# Patient Record
Sex: Female | Born: 1990 | Race: White | Hispanic: No | Marital: Married | Smoking: Current every day smoker
Health system: Southern US, Community
[De-identification: ages and names within clinical notes are randomized; demographics above are authoritative.]

## PROBLEM LIST (undated history)

## (undated) DIAGNOSIS — E559 Vitamin D deficiency, unspecified: Secondary | ICD-10-CM

## (undated) DIAGNOSIS — J45909 Unspecified asthma, uncomplicated: Secondary | ICD-10-CM

## (undated) HISTORY — DX: Vitamin D deficiency, unspecified: E55.9

## (undated) HISTORY — DX: Unspecified asthma, uncomplicated: J45.909

## (undated) HISTORY — PX: TOOTH EXTRACTION: SUR596

---

## 2018-11-15 ENCOUNTER — Ambulatory Visit (INDEPENDENT_AMBULATORY_CARE_PROVIDER_SITE_OTHER): Payer: 59 | Admitting: Physician Assistant

## 2018-11-15 ENCOUNTER — Encounter: Payer: Self-pay | Admitting: Physician Assistant

## 2018-11-15 VITALS — BP 140/78 | HR 97 | Temp 99.3°F | Ht 62.0 in | Wt 179.2 lb

## 2018-11-15 DIAGNOSIS — F419 Anxiety disorder, unspecified: Secondary | ICD-10-CM | POA: Diagnosis not present

## 2018-11-15 DIAGNOSIS — F321 Major depressive disorder, single episode, moderate: Secondary | ICD-10-CM | POA: Diagnosis not present

## 2018-11-15 DIAGNOSIS — E559 Vitamin D deficiency, unspecified: Secondary | ICD-10-CM

## 2018-11-15 LAB — CBC WITH DIFFERENTIAL/PLATELET
Basophils Absolute: 0 10*3/uL (ref 0.0–0.1)
Basophils Relative: 0.6 % (ref 0.0–3.0)
Eosinophils Absolute: 0.1 10*3/uL (ref 0.0–0.7)
Eosinophils Relative: 1.4 % (ref 0.0–5.0)
HCT: 39.6 % (ref 36.0–46.0)
Hemoglobin: 13.2 g/dL (ref 12.0–15.0)
Lymphocytes Relative: 23.1 % (ref 12.0–46.0)
Lymphs Abs: 1.7 10*3/uL (ref 0.7–4.0)
MCHC: 33.4 g/dL (ref 30.0–36.0)
MCV: 90.6 fl (ref 78.0–100.0)
Monocytes Absolute: 0.5 10*3/uL (ref 0.1–1.0)
Monocytes Relative: 6.1 % (ref 3.0–12.0)
Neutro Abs: 5.1 10*3/uL (ref 1.4–7.7)
Neutrophils Relative %: 68.8 % (ref 43.0–77.0)
PLATELETS: 320 10*3/uL (ref 150.0–400.0)
RBC: 4.37 Mil/uL (ref 3.87–5.11)
RDW: 12.6 % (ref 11.5–15.5)
WBC: 7.4 10*3/uL (ref 4.0–10.5)

## 2018-11-15 LAB — COMPREHENSIVE METABOLIC PANEL
ALT: 12 U/L (ref 0–35)
AST: 13 U/L (ref 0–37)
Albumin: 4.4 g/dL (ref 3.5–5.2)
Alkaline Phosphatase: 52 U/L (ref 39–117)
BUN: 10 mg/dL (ref 6–23)
CO2: 27 meq/L (ref 19–32)
Calcium: 9.6 mg/dL (ref 8.4–10.5)
Chloride: 102 mEq/L (ref 96–112)
Creatinine, Ser: 0.81 mg/dL (ref 0.40–1.20)
GFR: 89.78 mL/min (ref >60.00)
Glucose, Bld: 95 mg/dL (ref 70–99)
POTASSIUM: 4.1 meq/L (ref 3.5–5.1)
Sodium: 137 mEq/L (ref 135–145)
Total Bilirubin: 0.4 mg/dL (ref 0.2–1.2)
Total Protein: 6.9 g/dL (ref 6.0–8.3)

## 2018-11-15 LAB — VITAMIN D 25 HYDROXY (VIT D DEFICIENCY, FRACTURES): VITD: 25.52 ng/mL — ABNORMAL LOW (ref 30.00–100.00)

## 2018-11-15 LAB — TSH: TSH: 1.11 u[IU]/mL (ref 0.35–4.50)

## 2018-11-15 MED ORDER — SERTRALINE HCL 25 MG PO TABS: 25.0000 mg | ORAL_TABLET | Freq: Every day | ORAL | 1 refills | Status: DC

## 2018-11-15 NOTE — Patient Instructions (Signed)
It was great to see you!  Start Zoloft 25 mg daily.  Please find a therapist if you can!  Let's follow-up in 4-6 weeks, sooner if you have concerns.  Take care,  Jarold MottoSamantha Plez Belton PA-C

## 2018-11-15 NOTE — Progress Notes (Signed)
Diane [name] is a 27 y.o. female here for a new problem.  History of Present Illness:   Chief Complaint  Patient presents with  . Establish Care  . Anxiety  . Depression    HPI  Depression and anxiety -- patient with an increase in anxiety and depression over the past year. No significant hx in the past, but possible intermittent issues. She states that she is dealing with family drama, work stress, and difficulty maintaining work-life balance. Denies SI/HI. Has tried CBD oil but that hasn't helped for her. Husband and family are good support system. Is interested in therapy but doesn't have the time to go. Also has hx vit D deficiency. Has taken high doses of vit D in the past.   Past Medical History:  Diagnosis Date  . Asthma   . Vitamin D deficiency      Social History   Socioeconomic History  . Marital status: Married    Spouse name: Not on file  . Number of children: Not on file  . Years of education: Not on file  . Highest education level: Not on file  Occupational History  . Not on file  Social Needs  . Financial resource strain: Not on file  . Food insecurity:    Worry: Not on file    Inability: Not on file  . Transportation needs:    Medical: Not on file    Non-medical: Not on file  Tobacco Use  . Smoking status: Former Smoker    Last attempt to quit: 03/16/2018    Years since quitting: 0.6  . Smokeless tobacco: Never Used  Substance and Sexual Activity  . Alcohol use: Yes    Comment: occ  . Drug use: Never  . Sexual activity: Yes  Lifestyle  . Physical activity:    Days per week: Not on file    Minutes per session: Not on file  . Stress: Not on file  Relationships  . Social connections:    Talks on phone: Not on file    Gets together: Not on file    Attends religious service: Not on file    Active member of club or organization: Not on file    Attends meetings of clubs or organizations: Not on file    Relationship status: Not on file  .  Intimate partner violence:    Fear of current or ex partner: Not on file    Emotionally abused: Not on file    Physically abused: Not on file    Forced sexual activity: Not on file  Other Topics Concern  . Not on file  Social History Narrative   Married, no children   Works with loans for [employer]    Past Surgical History:  Procedure Laterality Date  . TOOTH EXTRACTION      Family History  Problem Relation Age of Onset  . Throat cancer Father     No Known Allergies  Current Medications:   Current Outpatient Medications:  .  norgestimate-ethinyl estradiol (ORTHO-CYCLEN,SPRINTEC,PREVIFEM) 0.25-35 MG-MCG tablet, Take 1 tablet by mouth daily., Disp: , Rfl:  .  sertraline (ZOLOFT) 25 MG tablet, Take 1 tablet (25 mg total) by mouth daily., Disp: 30 tablet, Rfl: 1   Review of Systems:   ROS  Negative unless otherwise specified per HPI.  Vitals:   Vitals:   11/15/18 1302  BP: 140/78  Pulse: 97  Temp: 99.3 F (37.4 C)  TempSrc: Oral  SpO2: 98%  Weight: 179 lb 3.2 oz (  81.3 kg)  Height: 5\' 2"  (1.575 m)     Body mass index is 32.78 kg/m.  Physical Exam:   Physical Exam Vitals signs and nursing note reviewed.  Constitutional:      General: She is not in acute distress.    Appearance: She is well-developed. She is not ill-appearing or toxic-appearing.  HENT:     Head: Normocephalic and atraumatic.  Cardiovascular:     Rate and Rhythm: Normal rate and regular rhythm.     Heart sounds: Normal heart sounds.  Pulmonary:     Effort: Pulmonary effort is normal. No accessory muscle usage or respiratory distress.     Breath sounds: Normal breath sounds.  Skin:    General: Skin is warm and dry.  Neurological:     Mental Status: She is alert.  Psychiatric:        Mood and Affect: Affect is tearful.        Speech: Speech normal.        Behavior: Behavior is cooperative.       No results found for this or any previous visit.  Assessment and Plan:   Diane [name] was  seen today for establish care, anxiety and depression.  Diagnoses and all orders for this visit:  Vitamin D deficiency Re-check today. -     VITAMIN D 25 Hydroxy (Vit-D Deficiency, Fractures)  Anxiety and Depression, major, single episode, moderate (HCC) No SI/HI on exam today. Check labs for organic cause of symptoms. Start Zoloft 25 mg daily. Provided list of therapists. Follow-up in 4-6 weeks, sooner if issues. I discussed with patient that if they develop any SI, to tell someone immediately and seek medical attention. -     CBC with Differential/Platelet -     Comprehensive metabolic panel -     TSH  Other orders -     sertraline (ZOLOFT) 25 MG tablet; Take 1 tablet (25 mg total) by mouth daily.    . Reviewed expectations re: course of current medical issues. . Discussed self-management of symptoms. . Outlined signs and symptoms indicating need for more acute intervention. . Patient verbalized understanding and all questions were answered. . See orders for this visit as documented in the electronic medical record. . Patient received an After-Visit Summary.   Jarold Motto, PA-C

## 2018-12-13 ENCOUNTER — Ambulatory Visit (INDEPENDENT_AMBULATORY_CARE_PROVIDER_SITE_OTHER): Payer: 59 | Admitting: Physician Assistant

## 2018-12-13 ENCOUNTER — Encounter: Payer: Self-pay | Admitting: Physician Assistant

## 2018-12-13 VITALS — BP 100/56 | HR 69 | Temp 99.0°F | Ht 62.0 in | Wt 181.4 lb

## 2018-12-13 DIAGNOSIS — Z23 Encounter for immunization: Secondary | ICD-10-CM

## 2018-12-13 DIAGNOSIS — F321 Major depressive disorder, single episode, moderate: Secondary | ICD-10-CM

## 2018-12-13 DIAGNOSIS — F419 Anxiety disorder, unspecified: Secondary | ICD-10-CM | POA: Diagnosis not present

## 2018-12-13 MED ORDER — SERTRALINE HCL 25 MG PO TABS: 25.0000 mg | ORAL_TABLET | Freq: Every day | ORAL | 1 refills | Status: DC

## 2018-12-13 MED ORDER — NORGESTIMATE-ETH ESTRADIOL 0.25-35 MG-MCG PO TABS: 1.0000 | ORAL_TABLET | Freq: Every day | ORAL | 11 refills | Status: DC

## 2018-12-13 NOTE — Progress Notes (Signed)
Diane [name] is a 28 y.o. female is here to discuss: Anxiety & Depression.  I acted as a Neurosurgeon for [employer], PA-C Diane Mull, LPN  History of Present Illness:   Chief Complaint  Patient presents with  . Anxiety  . Depression    HPI  Anxiety & Depression Pt here for one month follow up. Pt states she is feeling better since last visit and taking Zoloft 25 mg daily. Pt was having nausea in the beginning but has resolved. She feels as though her anxiety is slowly improving, doesn't feel numb. Denies significant side effects from medications.   There are no preventive care reminders to display for this patient.  Past Medical History:  Diagnosis Date  . Asthma   . Vitamin D deficiency      Social History   Socioeconomic History  . Marital status: Married    Spouse name: Not on file  . Number of children: Not on file  . Years of education: Not on file  . Highest education level: Not on file  Occupational History  . Not on file  Social Needs  . Financial resource strain: Not on file  . Food insecurity:    Worry: Not on file    Inability: Not on file  . Transportation needs:    Medical: Not on file    Non-medical: Not on file  Tobacco Use  . Smoking status: Former Smoker    Last attempt to quit: 03/16/2018    Years since quitting: 0.7  . Smokeless tobacco: Never Used  Substance and Sexual Activity  . Alcohol use: Yes    Comment: occ  . Drug use: Never  . Sexual activity: Yes  Lifestyle  . Physical activity:    Days per week: Not on file    Minutes per session: Not on file  . Stress: Not on file  Relationships  . Social connections:    Talks on phone: Not on file    Gets together: Not on file    Attends religious service: Not on file    Active member of club or organization: Not on file    Attends meetings of clubs or organizations: Not on file    Relationship status: Not on file  . Intimate partner violence:    Fear of current or ex  partner: Not on file    Emotionally abused: Not on file    Physically abused: Not on file    Forced sexual activity: Not on file  Other Topics Concern  . 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 TOOTH EXTRACTION      Family History  Problem Relation Age of Onset  . Throat cancer Father     PMHx, SurgHx, SocialHx, FamHx, Medications, and Allergies were reviewed in the Visit Navigator and updated as appropriate.   There are no active problems to display for this patient.   Social History   Tobacco Use  . Smoking status: Former Smoker    Last attempt to quit: 03/16/2018    Years since quitting: 0.7  . Smokeless tobacco: Never Used  Substance Use Topics  . Alcohol use: Yes    Comment: occ  . Drug use: Never    Current Medications and Allergies:    Current Outpatient Medications:  .  norgestimate-ethinyl estradiol (ORTHO-CYCLEN,SPRINTEC,PREVIFEM) 0.25-35 MG-MCG tablet, Take 1 tablet by mouth  daily., Disp: 1 Package, Rfl: 11 .  sertraline (ZOLOFT) 25 MG tablet, Take 1 tablet (25 mg total) by mouth daily., Disp: 30 tablet, Rfl: 1  No Known Allergies  Review of Systems   Review of Systems  Negative unless otherwise specified per HPI.  Vitals:   Vitals:   12/13/18 1307  BP: (!) 100/56  Pulse: 69  Temp: 99 F (37.2 C)  TempSrc: Oral  SpO2: 96%  Weight: 181 lb 6.1 oz (82.3 kg)  Height: 5\' 2"  (1.575 m)     Body mass index is 33.17 kg/m.   Physical Exam:    Physical Exam Vitals signs and nursing note reviewed.  Constitutional:      General: She is not in acute distress.    Appearance: She is well-developed. She is not ill-appearing or toxic-appearing.  Cardiovascular:     Rate and Rhythm: Normal rate and regular rhythm.     Pulses: Normal pulses.     Heart sounds: Normal heart sounds, S1 normal and S2 normal.     Comments: No LE  edema Pulmonary:     Effort: Pulmonary effort is normal.     Breath sounds: Normal breath sounds.  Skin:    General: Skin is warm and dry.  Neurological:     Mental Status: She is alert.     GCS: GCS eye subscore is 4. GCS verbal subscore is 5. GCS motor subscore is 6.  Psychiatric:        Speech: Speech normal.        Behavior: Behavior normal. Behavior is cooperative.      Assessment and Plan:    Diane [name] was seen today for anxiety and depression.  Diagnoses and all orders for this visit:  Anxiety and Depression, major, single episode, moderate (HCC) Doing well on Zoloft 25 mg daily. She would like to wait two weeks to see if she has any further improvement on current dosage. She states that she may increase to 50 mg after that if she feels like she would like more improvement with her anxiety. Patient was advised as follows:  1. Continue Zoloft 25 mg daily. 2. If after two weeks, you are interested in increasing your dosage, take 2 x 25 mg tablets daily (50 mg total). Take this daily and keep us posted on your progress.  Need for prophylactic vaccination with combined diphtheria-tetanus-pertussis (DTP) vaccine -     Tdap vaccine greater than or equal to 7yo IM  Other orders -     sertraline (ZOLOFT) 25 MG tablet; Take 1 tablet (25 mg total) by mouth daily. -     norgestimate-ethinyl estradiol (ORTHO-CYCLEN,SPRINTEC,PREVIFEM) 0.25-35 MG-MCG tablet; Take 1 tablet by mouth daily.   . Reviewed expectations re: course of current medical issues. . Discussed self-management of symptoms. . Outlined signs and symptoms indicating need for more acute intervention. . Patient verbalized understanding and all questions were answered. . See orders for this visit as documented in the electronic medical record. . Patient received an After Visit Summary.  CMA or LPN served as scribe during this visit. History, Physical, and Plan performed by medical provider. The above documentation has  been reviewed and is accurate and complete.   Diane MottoSamantha Donivin Wirt, PA-C [place] [place] 12/13/2018  Follow-up: Return in about 6 months (around 06/13/2019) for Medication F/U.

## 2018-12-13 NOTE — Patient Instructions (Signed)
It was great to see you!  1. Continue Zoloft 25 mg daily. 2. If after two weeks, you are interested in increasing your dosage, take 2 x 25 mg tablets daily (50 mg total). Take this daily and keep [place] posted on your progress.  Let's follow-up in 6 months, sooner if you have concerns.  Take care,  Jarold Motto PA-C

## 2018-12-29 ENCOUNTER — Encounter: Payer: Self-pay | Admitting: Physician Assistant

## 2019-01-04 ENCOUNTER — Other Ambulatory Visit: Payer: Self-pay | Admitting: Physician Assistant

## 2019-01-04 MED ORDER — SERTRALINE HCL 50 MG PO TABS: 50.0000 mg | ORAL_TABLET | Freq: Every day | ORAL | 1 refills | Status: DC

## 2019-05-17 ENCOUNTER — Encounter: Payer: Self-pay | Admitting: Physician Assistant

## 2019-06-06 ENCOUNTER — Encounter: Payer: Self-pay | Admitting: Physician Assistant

## 2019-06-06 ENCOUNTER — Ambulatory Visit (INDEPENDENT_AMBULATORY_CARE_PROVIDER_SITE_OTHER): Payer: 59 | Admitting: Physician Assistant

## 2019-06-06 DIAGNOSIS — E669 Obesity, unspecified: Secondary | ICD-10-CM

## 2019-06-06 DIAGNOSIS — F321 Major depressive disorder, single episode, moderate: Secondary | ICD-10-CM | POA: Insufficient documentation

## 2019-06-06 DIAGNOSIS — M25512 Pain in left shoulder: Secondary | ICD-10-CM

## 2019-06-06 DIAGNOSIS — F419 Anxiety disorder, unspecified: Secondary | ICD-10-CM | POA: Insufficient documentation

## 2019-06-06 DIAGNOSIS — Z713 Dietary counseling and surveillance: Secondary | ICD-10-CM

## 2019-06-06 NOTE — Progress Notes (Signed)
Virtual Visit via Video   I connected with Diane [name] on 06/06/19 at  1:00 PM EDT by a video enabled telemedicine application and verified that I am speaking with the correct person using two identifiers. Location patient: Home Location provider: [facility], Office Persons participating in the virtual visit: Lenae, [place] PA-C  I discussed the limitations of evaluation and management by telemedicine and the availability of in person appointments. The patient expressed understanding and agreed to proceed.  Subjective:   HPI:   Discuss Anxiety/Depression, L shoulder pain, and nutrition.  Depression/Anxiety -- currently on Zoloft 50 mg daily. She feels like her depression is currently well controlled, but she is significantly stressed with her job. She denies SI/HI. She has little time for self care because she is overworked with her job and they are short staffed. She does feel like she is sleeping well.  L shoulder pain -- she reports pain x 3-4 months. No inciting event. Was active in [employer] prior to COVID-19. She has issues with ROM. She denies numbness/tingling. She has pain when sleeping due to her shoulder. She has taken ibuprofen without relief. She is wondering if she should see a sports medicine provider.  Obesity/Weight gain -- BMI is 34.9. She has had about 10 lb weight gain in the past few months.  Dietary recall: Wakes up at [employer] with almond milk; yogurt Lunch -- leftovers or snacks around the house Dinner -- takeout (taco bell or [employer]) OR prepared meal at home Snacks -- throughout the day will eat "junk" Beverages -- does drink 1-3 regular pepsi's daily  Weight: Wt Readings from Last 3 Encounters:  12/13/18 181 lb 6.1 oz (82.3 kg)  11/15/18 179 lb 3.2 oz (81.3 kg)   Exercise: Occasionally walking  Sleep: Good   Goals: 1- lose weight 2- increase activity 3- reduce pepsi intake  Estimated  daily energy needs: Calories: 1400-1600 kcal Protein: 60-70 g Fluid: around 2000 ml  Wt Readings from Last 3 Encounters:  12/13/18 181 lb 6.1 oz (82.3 kg)  11/15/18 179 lb 3.2 oz (81.3 kg)     ROS: See pertinent positives and negatives per HPI.  Patient Active Problem List   Diagnosis Date Noted  . Anxiety 06/06/2019  . Depression, major, single episode, moderate ([place]) 06/06/2019    Social History   Tobacco Use  . Smoking status: Former Smoker    Quit date: 03/16/2018    Years since quitting: 1.2  . Smokeless tobacco: Never Used  Substance Use Topics  . Alcohol use: Yes    Comment: occ    Current Outpatient Medications:  .  norgestimate-ethinyl estradiol (ORTHO-CYCLEN,SPRINTEC,PREVIFEM) 0.25-35 MG-MCG tablet, Take 1 tablet by mouth daily., Disp: 1 Package, Rfl: 11 .  sertraline (ZOLOFT) 50 MG tablet, Take 1 tablet (50 mg total) by mouth daily., Disp: 90 tablet, Rfl: 1  No Known Allergies  Objective:   VITALS: Per patient if applicable, see vitals. GENERAL: Alert, appears well and in no acute distress. HEENT: Atraumatic, conjunctiva clear, no obvious abnormalities on inspection of external nose and ears. NECK: Normal movements of the head and neck. CARDIOPULMONARY: No increased WOB. Speaking in clear sentences. I:E ratio WNL.  MS: Moves all visible extremities without noticeable abnormality. PSYCH: Pleasant and cooperative, well-groomed. Speech normal rate and rhythm. Affect is appropriate. Insight and judgement are appropriate. Attention is focused, linear, and appropriate.  NEURO: CN grossly intact. Oriented as arrived to appointment on time with no  prompting. Moves both UE equally.  SKIN: No obvious lesions, wounds, erythema, or cyanosis noted on face or hands.  Assessment and Plan:   Diagnoses and all orders for this visit:  Anxiety; Depression, major, single episode, moderate (HCC) Uncontrolled. We discussed need for self-care. She would like to trial Zoloft  100 mg daily during this stressful time with her work. Provided instructions on how to do this, may resume 50 mg Zoloft if she does not feel improved with Zoloft 100 mg daily. Denies SI/HI.  Acute pain of left shoulder Referral to sports medicine per patient request.  Obesity, unspecified classification, unspecified obesity type, unspecified whether serious comorbidity present; Encounter for nutritional counseling Discussed specific, individualized recommendations regarding nutrition including decreasing sugary beverages, limiting portions, balancing out meals, and eating regularly throughout the day. Handouts provided included: Balanced Snack List. Provided emotional support and encouraged slow, steady weight loss. Patient's questions answered throughout encounter. Follow-up with me prn.  Goals: 1. Reduce pepsi intake, if craves soda, try diet coke to reduce total calorie intake. 2. When cooking dinner, try to make enough for lunches for the next few days. Portion out prior to serving to avoid over eating. 3. Goal of walking 30 min two times a week and roller skating one day a week. 4. Consider getting stand-up desk. 5. Work on Corporate investment bankerhealthier choices at [employer] and [employer] -- options discussed.  . Reviewed expectations re: course of current medical issues. . Discussed self-management of symptoms. . Outlined signs and symptoms indicating need for more acute intervention. . Patient verbalized understanding and all questions were answered. Marland Kitchen. Health Maintenance issues including appropriate healthy diet, exercise, and smoking avoidance were discussed with patient. . See orders for this visit as documented in the electronic medical record.  I discussed the assessment and treatment plan with the patient. The patient was provided an opportunity to ask questions and all were answered. The patient agreed with the plan and demonstrated an understanding of the instructions.   The patient was advised to  call back or seek an in-person evaluation if the symptoms worsen or if the condition fails to improve as anticipated.   [place] LakeSamantha Ryker [place], [place]PA 06/06/2019

## 2019-06-22 ENCOUNTER — Encounter: Payer: Self-pay | Admitting: Physician Assistant

## 2019-06-23 ENCOUNTER — Other Ambulatory Visit: Payer: Self-pay | Admitting: Physician Assistant

## 2019-06-23 MED ORDER — SERTRALINE HCL 50 MG PO TABS: 75.0000 mg | ORAL_TABLET | Freq: Every day | ORAL | 0 refills | Status: DC

## 2019-07-05 ENCOUNTER — Ambulatory Visit: Payer: Self-pay

## 2019-07-05 ENCOUNTER — Ambulatory Visit (INDEPENDENT_AMBULATORY_CARE_PROVIDER_SITE_OTHER): Payer: 59 | Admitting: Family Medicine

## 2019-07-05 ENCOUNTER — Encounter: Payer: Self-pay | Admitting: Family Medicine

## 2019-07-05 ENCOUNTER — Other Ambulatory Visit: Payer: Self-pay

## 2019-07-05 VITALS — BP 104/82 | HR 94 | Ht 62.0 in | Wt 181.0 lb

## 2019-07-05 DIAGNOSIS — M7502 Adhesive capsulitis of left shoulder: Secondary | ICD-10-CM | POA: Diagnosis not present

## 2019-07-05 DIAGNOSIS — G8929 Other chronic pain: Secondary | ICD-10-CM | POA: Diagnosis not present

## 2019-07-05 DIAGNOSIS — M25512 Pain in left shoulder: Secondary | ICD-10-CM

## 2019-07-05 DIAGNOSIS — M7500 Adhesive capsulitis of unspecified shoulder: Secondary | ICD-10-CM | POA: Insufficient documentation

## 2019-07-05 MED ORDER — MELOXICAM 15 MG PO TABS: 15.0000 mg | ORAL_TABLET | Freq: Every day | ORAL | 0 refills | Status: DC

## 2019-07-05 MED ORDER — VITAMIN D (ERGOCALCIFEROL) 1.25 MG (50000 UNIT) PO CAPS: 50000.0000 [IU] | ORAL_CAPSULE | ORAL | 0 refills | Status: DC

## 2019-07-05 NOTE — Patient Instructions (Signed)
Good to see you  Exercise 3 times a week Ice 20 minutes 2 times daily. Usually after activity and before bed. Meloxicam for 10 days Once weekly vitamin D for 12 weeks.  Turmeric 500mg  daily  See me again 5- 6 weeks
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
## 2019-07-05 NOTE — Assessment & Plan Note (Signed)
Patient does have significant decrease in external range of motion.  I do not see any nodular masses but some mild subacromial bursitis that could be contributing.  Could be early frozen shoulder and given exercises specifically for this.  Meloxicam and vitamin D given secondary to the calcific changes.  Discussed the possibility of needing injection will but will try to avoid it.  Considerations also include formal physical therapy.  Follow-up with me again in 4 to 6 weeks

## 2019-07-29 ENCOUNTER — Other Ambulatory Visit: Payer: Self-pay | Admitting: Family Medicine

## 2019-08-09 ENCOUNTER — Ambulatory Visit: Payer: 59 | Admitting: Family Medicine

## 2019-08-28 ENCOUNTER — Encounter: Payer: Self-pay | Admitting: Physician Assistant

## 2019-08-28 ENCOUNTER — Other Ambulatory Visit: Payer: Self-pay | Admitting: Family Medicine

## 2019-09-01 ENCOUNTER — Telehealth: Payer: Self-pay
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
## 2019-09-02 ENCOUNTER — Encounter: Payer: Self-pay | Admitting: Family Medicine

## 2019-09-02 ENCOUNTER — Ambulatory Visit (INDEPENDENT_AMBULATORY_CARE_PROVIDER_SITE_OTHER): Payer: 59 | Admitting: Family Medicine

## 2019-09-02 DIAGNOSIS — S134XXA Sprain of ligaments of cervical spine, initial encounter: Secondary | ICD-10-CM | POA: Diagnosis not present

## 2019-09-02 MED ORDER — TIZANIDINE HCL 4 MG PO TABS: 4.0000 mg | ORAL_TABLET | Freq: Every evening | ORAL | 2 refills | Status: DC
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
## 2019-09-08 NOTE — Progress Notes (Deleted)
[facility] [address], [place] [address] Phone: [phone] Subjective:    I'm seeing this patient by the request  of:    CC:   [id number]  Diane [name] is a 28 y.o. female coming in with complaint of ***  Onset-  Location Duration-  Character- Aggravating factors- Reliving factors-  Therapies tried-  Severity-     Past Medical History:  Diagnosis Date  . Asthma   . Vitamin D deficiency    Past Surgical History:  Procedure Laterality Date  . TOOTH EXTRACTION     Social History   Socioeconomic History  . Marital status: Married    Spouse name: Not on file  . Number of children: Not on file  . Years of education: Not on file  . Highest education level: Not on file  Occupational History  . Not on file  Social Needs  . Financial resource strain: Not on file  . Food insecurity    Worry: Not on file    Inability: Not on file  . Transportation needs    Medical: Not on file    Non-medical: Not on file  Tobacco Use  . Smoking status: Former Smoker    Quit date: 03/16/2018    Years since quitting: 1.4  . Smokeless tobacco: Never Used  Substance and Sexual Activity  . Alcohol use: Yes    Comment: occ  . Drug use: Never  . Sexual activity: Yes  Lifestyle  . Physical activity    Days per week: Not on file    Minutes per session: Not on file  . Stress: Not on file  Relationships  . Social Herbalist on phone: Not on file    Gets together: Not on file    Attends religious service: Not on file    Active member of club or organization: Not on file    Attends meetings of clubs or organizations: Not on file    Relationship status: Not on file  Other Topics Concern  . Not on file  Social History Narrative   Married, no children   Works with loans for mortgages   No Known Allergies Family History  Problem Relation Age of Onset  . Throat cancer Father     Current Outpatient Medications  (Endocrine & Metabolic):  .  norgestimate-ethinyl estradiol (ORTHO-CYCLEN,SPRINTEC,PREVIFEM) 0.25-35 MG-MCG tablet, Take 1 tablet by mouth daily.    Current Outpatient Medications (Analgesics):  .  meloxicam (MOBIC) 15 MG tablet, TAKE 1 TABLET BY MOUTH EVERY DAY   Current Outpatient Medications (Other):  .  sertraline (ZOLOFT) 50 MG tablet, Take 1.5 tablets (75 mg total) by mouth daily. Marland Kitchen  tiZANidine (ZANAFLEX) 4 MG tablet, Take 1 tablet (4 mg total) by mouth Nightly for 10 days. .  Vitamin D, Ergocalciferol, (DRISDOL) 1.25 MG (50000 UT) CAPS capsule, Take 1 capsule (50,000 Units total) by mouth every 7 (seven) days.    Past medical history, social, surgical and family history all reviewed in electronic medical record.  No pertanent information unless stated regarding to the chief complaint.   Review of Systems:  No headache, visual changes, nausea, vomiting, diarrhea, constipation, dizziness, abdominal pain, skin rash, fevers, chills, night sweats, weight loss, swollen lymph nodes, body aches, joint swelling, muscle aches, chest pain, shortness of breath, mood changes.   Objective  There were no vitals taken for this visit. Systems examined below as of    General: No apparent distress alert and  oriented x3 mood and affect normal, dressed appropriately.  HEENT: Pupils equal, extraocular movements intact  Respiratory: Patient's speak in full sentences and does not appear short of breath  Cardiovascular: No lower extremity edema, non tender, no erythema  Skin: Warm dry intact with no signs of infection or rash on extremities or on axial skeleton.  Abdomen: Soft nontender  Neuro: Cranial nerves II through XII are intact, neurovascularly intact in all extremities with 2+ DTRs and 2+ pulses.  Lymph: No lymphadenopathy of posterior or anterior cervical chain or axillae bilaterally.  Gait normal with good balance and coordination.  MSK:  Non tender with full range of motion and good  stability and symmetric strength and tone of shoulders, elbows, wrist, hip, knee and ankles bilaterally.     Impression and Recommendations:     This case required medical decision making of moderate complexity. The above documentation has been reviewed and is accurate and complete Judi Saa, DO       Note: This dictation was prepared with Dragon dictation along with smaller phrase technology. Any transcriptional errors that result from this process are unintentional.

## 2019-09-09 ENCOUNTER — Ambulatory Visit: Payer: 59 | Admitting: Family Medicine

## 2019-09-15 ENCOUNTER — Encounter: Payer: Self-pay | Admitting: Family Medicine

## 2019-09-15 ENCOUNTER — Ambulatory Visit (INDEPENDENT_AMBULATORY_CARE_PROVIDER_SITE_OTHER): Payer: 59 | Admitting: Family Medicine

## 2019-09-15 ENCOUNTER — Other Ambulatory Visit: Payer: Self-pay

## 2019-09-15 DIAGNOSIS — M999 Biomechanical lesion, unspecified: Secondary | ICD-10-CM | POA: Diagnosis not present

## 2019-09-15 DIAGNOSIS — M7502 Adhesive capsulitis of left shoulder: Secondary | ICD-10-CM

## 2019-09-15 DIAGNOSIS — S134XXD Sprain of ligaments of cervical spine, subsequent encounter: Secondary | ICD-10-CM

## 2019-09-15 NOTE — Patient Instructions (Signed)
Continue exercises Watch posture  See me in 5-6 weeks

## 2019-09-15 NOTE — Assessment & Plan Note (Signed)
Improvement noted on exam.  Tolerated the procedure well.  Started manipulation and I believe the patient will do well.

## 2019-09-15 NOTE — Progress Notes (Signed)
[facility] [address] [place], [place] [address] Phone: [phone] Subjective:   Fontaine No, am serving as a scribe for Dr. Hulan Saas.  I'm seeing this patient by the request  of:    CC:  Head injury follow-up [id number]  Evadean Sproule is a 28 y.o. female coming in with complaint of head injury. Concussion symptoms have resolved. Is now having left shoulder, anterior pain and cervical spine pain. Pain can be dull or sharp with movements. Does have tingling in her fingertips bilaterally with sleeping or when waking up. Is using IBU for pain. Also using zanaflex at night.      Past Medical History:  Diagnosis Date  . Asthma   . Vitamin D deficiency    Past Surgical History:  Procedure Laterality Date  . TOOTH EXTRACTION     Social History   Socioeconomic History  . Marital status: Married    Spouse name: Not on file  . Number of children: Not on file  . Years of education: Not on file  . Highest education level: Not on file  Occupational History  . Not on file  Social Needs  . Financial resource strain: Not on file  . Food insecurity    Worry: Not on file    Inability: Not on file  . Transportation needs    Medical: Not on file    Non-medical: Not on file  Tobacco Use  . Smoking status: Former Smoker    Quit date: 03/16/2018    Years since quitting: 1.5  . Smokeless tobacco: Never Used  Substance and Sexual Activity  . Alcohol use: Yes    Comment: occ  . Drug use: Never  . Sexual activity: Yes  Lifestyle  . Physical activity    Days per week: Not on file    Minutes per session: Not on file  . Stress: Not on file  Relationships  . Social Herbalist on phone: Not on file    Gets together: Not on file    Attends religious service: Not on file    Active member of club or organization: Not on file    Attends meetings of clubs or organizations: Not on file    Relationship status: Not on file  Other  Topics Concern  . Not on file  Social History Narrative   Married, no children   Works with loans for mortgages   No Known Allergies Family History  Problem Relation Age of Onset  . Throat cancer Father     Current Outpatient Medications (Endocrine & Metabolic):  .  norgestimate-ethinyl estradiol (ORTHO-CYCLEN,SPRINTEC,PREVIFEM) 0.25-35 MG-MCG tablet, Take 1 tablet by mouth daily.    Current Outpatient Medications (Analgesics):  .  meloxicam (MOBIC) 15 MG tablet, TAKE 1 TABLET BY MOUTH EVERY DAY   Current Outpatient Medications (Other):  .  sertraline (ZOLOFT) 50 MG tablet, Take 1.5 tablets (75 mg total) by mouth daily. .  Vitamin D, Ergocalciferol, (DRISDOL) 1.25 MG (50000 UT) CAPS capsule, Take 1 capsule (50,000 Units total) by mouth every 7 (seven) days.    Past medical history, social, surgical and family history all reviewed in electronic medical record.  No pertanent information unless stated regarding to the chief complaint.   Review of Systems:  No headache, visual changes, nausea, vomiting, diarrhea, constipation, dizziness, abdominal pain, skin rash, fevers, chills, night sweats, weight loss, swollen lymph nodes, body aches, joint swelling, muscle aches, chest pain, shortness of breath, mood  changes.   Objective  Blood pressure 110/76, pulse (!) 105, height 5\' 2"  (1.575 m), weight 191 lb (86.6 kg), SpO2 96 %.    General: No apparent distress alert and oriented x3 mood and affect normal, dressed appropriately.  HEENT: Pupils equal, extraocular movements intact  Respiratory: Patient's speak in full sentences and does not appear short of breath  Cardiovascular: No lower extremity edema, non tender, no erythema  Skin: Warm dry intact with no signs of infection or rash on extremities or on axial skeleton.  Abdomen: Soft nontender  Neuro: Cranial nerves II through XII are intact, neurovascularly intact in all extremities with 2+ DTRs and 2+ pulses.  Lymph: No  lymphadenopathy of posterior or anterior cervical chain or axillae bilaterally.  Gait antalgic gait MSK:  tender with full range of motion and good stability and symmetric strength and tone of  elbows, wrist, hip, knee and ankles bilaterally.  Neck exam does have loss of lordosis.  Tender to palpation in the paraspinal musculature of the lumbar spine as well as in the cervical spine with more tightness on the left trapezius than the right.  Negative Spurling's.  Still mild decreased range of motion of 5 degrees in all planes of the left shoulder full strength of the rotator cuff  Osteopathic findings C2 flexed rotated and side bent right C4 flexed rotated and side bent left C6 flexed rotated and side bent left T3 extended rotated and side bent right inhaled third rib T9 extended rotated and side bent left    Impression and Recommendations:     This case required medical decision making of moderate complexity. The above documentation has been reviewed and is accurate and complete , DO       Note: This dictation was prepared with Dragon dictation along with smaller phrase technology. Any transcriptional errors that result from this process are unintentional.
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
## 2019-09-15 NOTE — Assessment & Plan Note (Signed)
Decision today to treat with OMT was based on Physical Exam  After verbal consent patient was treated with HVLA, ME, FPR techniques in cervical, thoracic, ruib lumbar and sacral areas  Patient tolerated the procedure well with improvement in symptoms  Patient given exercises, stretches and lifestyle modifications  See medications in patient instructions if given  Patient will follow up in 4-6 weeks

## 2019-09-17 ENCOUNTER — Other Ambulatory Visit: Payer: Self-pay | Admitting: Physician Assistant

## 2019-09-17 ENCOUNTER — Encounter: Payer: Self-pay | Admitting: Family Medicine

## 2019-09-21 ENCOUNTER — Other Ambulatory Visit: Payer: Self-pay | Admitting: Family Medicine

## 2019-09-24 ENCOUNTER — Other Ambulatory Visit: Payer: Self-pay | Admitting: Family Medicine

## 2019-09-25 ENCOUNTER — Other Ambulatory Visit: Payer: Self-pay | Admitting: Family Medicine

## 2019-10-17 ENCOUNTER — Encounter: Payer: Self-pay | Admitting: Family Medicine

## 2019-10-17 ENCOUNTER — Ambulatory Visit (INDEPENDENT_AMBULATORY_CARE_PROVIDER_SITE_OTHER): Payer: 59 | Admitting: Family Medicine

## 2019-10-17 VITALS — BP 102/62 | HR 93 | Ht 62.0 in | Wt 191.0 lb

## 2019-10-17 DIAGNOSIS — M999 Biomechanical lesion, unspecified: Secondary | ICD-10-CM | POA: Diagnosis not present

## 2019-10-17 DIAGNOSIS — S134XXD Sprain of ligaments of cervical spine, subsequent encounter: Secondary | ICD-10-CM | POA: Diagnosis not present

## 2019-10-17 NOTE — Assessment & Plan Note (Signed)
Decision today to treat with OMT was based on Physical Exam  After verbal consent patient was treated with HVLA, ME, FPR techniques in cervical, thoracic, rib areas  Patient tolerated the procedure well with improvement in symptoms  Patient given exercises, stretches and lifestyle modifications  See medications in patient instructions if given  Patient will follow up in 8-12 weeks 
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
## 2019-10-17 NOTE — Patient Instructions (Signed)
Exercises 3x a week See me again in 2-3 months

## 2019-10-17 NOTE — Assessment & Plan Note (Signed)
Patient is doing relatively well overall.  Patient is near back to her baseline.  Continue the meloxicam as needed, will vitamin D, icing regimen, discussed posture and ergonomics, follow-up again in 3 to 4 months

## 2019-10-18 ENCOUNTER — Other Ambulatory Visit: Payer: Self-pay | Admitting: Family Medicine

## 2019-11-27 ENCOUNTER — Encounter: Payer: Self-pay | Admitting: Physician Assistant

## 2019-11-28 ENCOUNTER — Ambulatory Visit (INDEPENDENT_AMBULATORY_CARE_PROVIDER_SITE_OTHER): Payer: 59 | Admitting: Family Medicine

## 2019-11-28 DIAGNOSIS — R059 Cough, unspecified: Secondary | ICD-10-CM

## 2019-11-28 DIAGNOSIS — R05 Cough: Secondary | ICD-10-CM | POA: Diagnosis not present

## 2019-11-28 NOTE — Progress Notes (Signed)
    Chief Complaint:  Diane [name] is a 28 y.o. female who presents today for a virtual office visit with a chief complaint of cough.   Assessment/Plan:  Cough Likely viral URI.  Symptoms seem to be improving.  Can continue using over-the-counter meds as needed.  She will get Covid tested.  Discussed reasons return to care.  Follow-up as needed.    Subjective:  HPI:  Cough  Started about 3 weeks ago. Much worse a few days ago. Better today. Took sudafed which helped. Associated with rhinorrhea and fatigue. No sick contacts. No fevers or chills. No sinus pressure.   ROS: Per HPI  PMH: She reports that she quit smoking about 20 months ago. She has never used smokeless tobacco. She reports current alcohol use. She reports that she does not use drugs.      Objective/Observations  Physical Exam: Gen: NAD, resting comfortably Pulm: Normal work of breathing Neuro: Grossly normal, moves all extremities Psych: Normal affect and thought content  No results found for this or any previous visit (from the past 24 hour(s)).   Virtual Visit via Video   I connected with [name] on 11/28/19 at  9:40 AM EST by a video enabled telemedicine application and verified that I am speaking with the correct person using two identifiers. The limitations of evaluation and management by telemedicine and the availability of in person appointments were discussed. The patient expressed understanding and agreed to proceed.   Patient location: Home Provider location: [place] participating in the virtual visit: Myself and Patient     Algis Greenhouse. Jerline Pain, MD 11/28/2019 9:41 AM

## 2019-11-28 NOTE — Telephone Encounter (Signed)
See note

## 2019-11-28 NOTE — Telephone Encounter (Signed)
Please call pt and schedule Doxy visit with another provider today due to [place] is off. Thanks

## 2019-12-20 ENCOUNTER — Other Ambulatory Visit: Payer: Self-pay | Admitting: Physician Assistant

## 2019-12-22 ENCOUNTER — Other Ambulatory Visit: Payer: Self-pay | Admitting: Physician Assistant

## 2019-12-23 ENCOUNTER — Encounter: Payer: Self-pay | Admitting: Physician Assistant

## 2019-12-27 ENCOUNTER — Other Ambulatory Visit: Payer: Self-pay

## 2019-12-27 ENCOUNTER — Ambulatory Visit (INDEPENDENT_AMBULATORY_CARE_PROVIDER_SITE_OTHER): Payer: 59 | Admitting: Family Medicine

## 2019-12-27 ENCOUNTER — Encounter: Payer: Self-pay | Admitting: Family Medicine

## 2019-12-27 ENCOUNTER — Encounter: Payer: Self-pay | Admitting: Physician Assistant

## 2019-12-27 ENCOUNTER — Ambulatory Visit (INDEPENDENT_AMBULATORY_CARE_PROVIDER_SITE_OTHER): Payer: 59 | Admitting: Physician Assistant

## 2019-12-27 VITALS — BP 110/82 | HR 101 | Ht 62.0 in | Wt 188.0 lb

## 2019-12-27 DIAGNOSIS — M999 Biomechanical lesion, unspecified: Secondary | ICD-10-CM | POA: Diagnosis not present

## 2019-12-27 DIAGNOSIS — R41840 Attention and concentration deficit: Secondary | ICD-10-CM | POA: Diagnosis not present

## 2019-12-27 DIAGNOSIS — S134XXD Sprain of ligaments of cervical spine, subsequent encounter: Secondary | ICD-10-CM
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
## 2019-12-27 NOTE — Progress Notes (Signed)
[name] [address] [place] [address] Phone: [phone] Subjective:   I [name] am serving as a Neurosurgeon for Dr. Antoine Primas.  This visit occurred during the SARS-CoV-2 public health emergency.  Safety protocols were in place, including screening questions prior to the visit, additional usage of staff PPE, and extensive cleaning of exam room while observing appropriate contact time as indicated for disinfecting solutions.   I'm seeing this patient by the request  of:  Jarold Motto, PA  CC: Low back pain upper back pain follow-up  [id number]  Diane [name] is a 29 y.o. female coming in with complaint of back pain. Last seen on 10/17/2019 for OMT. Patient states she is doing ok. Bilateral shoulder pain.  Tightness overall.  Nothing low back to severe not stopping her from activity at the moment.  Patient mostly has them being in a certain position for long amount of time can be difficult.     Past Medical History:  Diagnosis Date  . Asthma   . Vitamin D deficiency    Past Surgical History:  Procedure Laterality Date  . TOOTH EXTRACTION     Social History   Socioeconomic History  . Marital status: Married    Spouse name: Not on file  . Number of children: Not on file  . Years of education: Not on file  . Highest education level: Not on file  Occupational History  . Not on file  Tobacco Use  . Smoking status: Former Smoker    Quit date: 03/16/2018    Years since quitting: 1.7  . Smokeless tobacco: Never Used  Substance and Sexual Activity  . Alcohol use: Yes    Comment: occ  . Drug use: Never  . Sexual activity: Yes  Other Topics Concern  . 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 Difficulty of Paying Living Expenses: Not on file  Food Insecurity:   . Worried About Programme researcher, broadcasting/film/video  in the Last Year: Not on file  . Ran Out of Food in the Last Year: Not on file  Transportation Needs:   . Lack of Transportation (Medical): Not on file  . Lack of Transportation (Non-Medical): Not on file  Physical Activity:   . Days of Exercise per Week: Not on file  . Minutes of Exercise per Session: Not on file  Stress:   . Feeling of Stress : Not on file  Social Connections:   . Frequency of Communication with Friends and Family: Not on file  . Frequency of Social Gatherings with Friends and Family: Not on file  . Attends Religious Services: Not on file  . Active Member of Clubs or Organizations: Not on file  . Attends Banker Meetings: Not on file  . Marital Status: Not on file   No Known Allergies Family History  Problem Relation Age of Onset  . Throat cancer Father     Current Outpatient Medications (Endocrine & Metabolic):  Marland Kitchen  SPRINTEC 28 0.25-35 MG-MCG tablet, TAKE 1 TABLET BY MOUTH EVERY DAY    Current Outpatient Medications (Analgesics):  .  meloxicam (MOBIC) 15 MG tablet, TAKE 1 TABLET BY MOUTH EVERY DAY   Current Outpatient Medications (Other):  .  sertraline (ZOLOFT) 50 MG tablet, TAKE 1 AND 1/2 TABLETS DAILY BY MOUTH .  Vitamin D,  Ergocalciferol, (DRISDOL) 1.25 MG (50000 UT) CAPS capsule, TAKE 1 CAPSULE (50,000 UNITS TOTAL) BY MOUTH EVERY 7 (SEVEN) DAYS.   Reviewed prior external information including notes and imaging from  primary care provider As well as notes that were available from care everywhere and other healthcare systems.  Past medical history, social, surgical and family history all reviewed in electronic medical record.  No pertanent information unless stated regarding to the chief complaint.   Review of Systems:  No headache, visual changes, nausea, vomiting, diarrhea, constipation, dizziness, abdominal pain, skin rash, fevers, chills, night sweats, weight loss, swollen lymph nodes, body aches, joint swelling, chest pain,  shortness of breath, mood changes. POSITIVE muscle aches  Objective  Blood pressure 110/82, pulse (!) 101, height 5\' 2"  (1.575 m), weight 188 lb (85.3 kg), SpO2 96 %.   General: No apparent distress alert and oriented x3 mood and affect normal, dressed appropriately.  HEENT: Pupils equal, extraocular movements intact  Respiratory: Patient's speak in full sentences and does not appear short of breath  Cardiovascular: No lower extremity edema, non tender, no erythema  Skin: Warm dry intact with no signs of infection or rash on extremities or on axial skeleton.  Abdomen: Soft nontender  Neuro: Cranial nerves II through XII are intact, neurovascularly intact in all extremities with 2+ DTRs and 2+ pulses.  Lymph: No lymphadenopathy of posterior or anterior cervical chain or axillae bilaterally.  Gait normal with good balance and coordination.  MSK:  tender with full range of motion and good stability and symmetric strength and tone of shoulders, elbows, wrist, hip, knee and ankles bilaterally.  Neck exam does have some tightness noted.  Some tender to palpation in the paraspinal musculature mostly in the parascapular region.  Some tightness in the thoracolumbar juncture also noted today.  Osteopathic findings C6 flexed rotated and side bent left T3 extended rotated and side bent right inhaled third rib T11 extended rotated and side bent left     Impression and Recommendations:     This case required medical decision making of moderate complexity. The above documentation has been reviewed and is accurate and complete Lyndal Pulley, DO       Note: This dictation was prepared with Dragon dictation along with smaller phrase technology. Any transcriptional errors that result from this process are unintentional.

## 2019-12-27 NOTE — Assessment & Plan Note (Signed)
Patient seems to be doing very well.  This may be an exacerbation of the chronic illness but should do well with conservative therapy in the long run.  No change in management at this point.  No change in prescription management at this point.  Due to social determinants including financial hardship as well as coronavirus outbreak we will hold on any other treatment such as physical therapy.  Patient will follow up with me again in 8 weeks

## 2019-12-27 NOTE — Assessment & Plan Note (Addendum)
Decision today to treat with OMT was based on Physical Exam  After verbal consent patient was treated with HVLA, ME, FPR techniques in cervical, thoracic, rib,  areas  Patient tolerated the procedure well with improvement in symptoms  Patient given exercises, stretches and lifestyle modifications  See medications in patient instructions if given  Patient will follow up in 4-8 weeks 

## 2019-12-27 NOTE — Patient Instructions (Addendum)
Good to see you Doing great keep it up See me again in 8 weeks

## 2020-01-09 ENCOUNTER — Other Ambulatory Visit: Payer: Self-pay | Admitting: *Deleted

## 2020-01-09 MED ORDER — NORGESTIMATE-ETH ESTRADIOL 0.25-35 MG-MCG PO TABS: 1.0000 | ORAL_TABLET | Freq: Every day | ORAL | 0 refills | Status: DC

## 2020-01-12 ENCOUNTER — Other Ambulatory Visit: Payer: Self-pay | Admitting: Physician Assistant

## 2020-01-16 ENCOUNTER — Other Ambulatory Visit: Payer: Self-pay | Admitting: Family Medicine

## 2020-02-11 ENCOUNTER — Other Ambulatory Visit: Payer: Self-pay | Admitting: Physician Assistant

## 2020-02-13 ENCOUNTER — Other Ambulatory Visit: Payer: Self-pay | Admitting: Physician Assistant

## 2020-02-13 ENCOUNTER — Encounter: Payer: Self-pay | Admitting: Physician Assistant

## 2020-02-13 MED ORDER — NORGESTIMATE-ETH ESTRADIOL 0.25-35 MG-MCG PO TABS: 1.0000 | ORAL_TABLET | Freq: Every day | ORAL | 0 refills | Status: DC

## 2020-02-17 ENCOUNTER — Encounter: Payer: Self-pay | Admitting: Physician Assistant

## 2020-02-17 NOTE — Telephone Encounter (Signed)
Spoke to pt told her to take her birth control pill again but wait till tomorrow to take the Zoloft and do not take together. Asked pt if she has been sick? Pt said she has been throwing up off and on past few weeks, took pregnancy test it was negative. Told her okay please make sure take birth control pill with food crackers or something and do a bland diet for a few days if symptoms do not improve need to make an appt. Pt verbalized understanding/

## 2020-02-22 ENCOUNTER — Ambulatory Visit: Payer: 59 | Admitting: Family Medicine

## 2020-03-13 ENCOUNTER — Ambulatory Visit: Payer: 59 | Admitting: Family Medicine

## 2020-03-13 ENCOUNTER — Other Ambulatory Visit: Payer: Self-pay | Admitting: Physician Assistant

## 2020-04-05 ENCOUNTER — Telehealth: Payer: Self-pay

## 2020-04-05 ENCOUNTER — Other Ambulatory Visit: Payer: Self-pay | Admitting: Physician Assistant

## 2020-04-05 ENCOUNTER — Encounter: Payer: Self-pay | Admitting: Physician Assistant

## 2020-04-05 DIAGNOSIS — Z789 Other specified health status: Secondary | ICD-10-CM

## 2020-04-05 MED ORDER — NORGESTIMATE-ETH ESTRADIOL 0.25-35 MG-MCG PO TABS: 1.0000 | ORAL_TABLET | Freq: Every day | ORAL | 1 refills | Status: DC

## 2020-04-05 NOTE — Telephone Encounter (Signed)
Patient wants to know if she can get additional refills for her birth control. Please advise.

## 2020-04-06 ENCOUNTER — Other Ambulatory Visit: Payer: Self-pay | Admitting: Physician Assistant

## 2020-04-06 DIAGNOSIS — Z789 Other specified health status: Secondary | ICD-10-CM

## 2020-04-06 MED ORDER — NORGESTIMATE-ETH ESTRADIOL 0.25-35 MG-MCG PO TABS: 1.0000 | ORAL_TABLET | Freq: Every day | ORAL | 3 refills | Status: DC

## 2020-04-06 NOTE — Telephone Encounter (Signed)
Sent!

## 2020-04-06 NOTE — Telephone Encounter (Signed)
Pt.notified

## 2020-04-09 ENCOUNTER — Other Ambulatory Visit: Payer: Self-pay | Admitting: Physician Assistant

## 2020-04-09 NOTE — Telephone Encounter (Signed)
Ok to fill 90 day script?

## 2020-04-10 ENCOUNTER — Ambulatory Visit (INDEPENDENT_AMBULATORY_CARE_PROVIDER_SITE_OTHER): Payer: 59 | Admitting: Family Medicine

## 2020-04-10 ENCOUNTER — Encounter: Payer: Self-pay | Admitting: Family Medicine

## 2020-04-10 ENCOUNTER — Other Ambulatory Visit: Payer: Self-pay

## 2020-04-10 VITALS — BP 100/70 | HR 98 | Ht 62.0 in | Wt 188.0 lb

## 2020-04-10 DIAGNOSIS — S134XXD Sprain of ligaments of cervical spine, subsequent encounter: Secondary | ICD-10-CM

## 2020-04-10 DIAGNOSIS — M999 Biomechanical lesion, unspecified: Secondary | ICD-10-CM | POA: Diagnosis not present

## 2020-04-10 NOTE — Progress Notes (Signed)
[name] [address] [place] [address] Phone: [phone] Subjective:   I [name] am serving as a Neurosurgeon for Dr. Antoine Primas.  This visit occurred during the SARS-CoV-2 public health emergency.  Safety protocols were in place, including screening questions prior to the visit, additional usage of staff PPE, and extensive cleaning of exam room while observing appropriate contact time as indicated for disinfecting solutions.   I'm seeing this patient by the request  of:  Jarold Motto, [place]  CC: Neck pain and upper back pain follow-up  [id number]  Diane [name] is a 29 y.o. female coming in with complaint of neck pain. Last seen 12/27/2019 for OMT. Patient is doing well overall. Here for OMT today.  Patient states overall she continues to notice improvement.  Less and less pain on a daily basis.  Some more soreness but nothing severe.    Past Medical History:  Diagnosis Date  . Asthma   . Vitamin D deficiency    Past Surgical History:  Procedure Laterality Date  . TOOTH EXTRACTION     Social History   Socioeconomic History  . Marital status: Married    Spouse name: Not on file  . Number of children: Not on file  . Years of education: Not on file  . Highest education level: Not on file  Occupational History  . Not on file  Tobacco Use  . Smoking status: Current Every Day Smoker    Last attempt to quit: 03/16/2018    Years since quitting: 2.0  . Smokeless tobacco: Never Used  Substance and Sexual Activity  . Alcohol use: Yes    Comment: occ  . Drug use: Never  . Sexual activity: Yes  Other Topics Concern  . 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 Difficulty of Paying Living Expenses:   Food Insecurity:   . Worried About Programme researcher, broadcasting/film/video in the Last Year:   . Barista in the Last  Year:   Transportation Needs:   . Freight forwarder (Medical):   Marland Kitchen Lack of Transportation (Non-Medical):   Physical Activity:   . Days of Exercise per Week:   . Minutes of Exercise per Session:   Stress:   . Feeling of Stress :   Social Connections:   . Frequency of Communication with Friends and Family:   . Frequency of Social Gatherings with Friends and Family:   . Attends Religious Services:   . Active Member of Clubs or Organizations:   . Attends Banker Meetings:   Marland Kitchen Marital Status:    No Known Allergies Family History  Problem Relation Age of Onset  . Throat cancer Father     Current Outpatient Medications (Endocrine & Metabolic):  .  norgestimate-ethinyl estradiol (SPRINTEC 28) 0.25-35 MG-MCG tablet, Take 1 tablet by mouth daily.      Current Outpatient Medications (Other):  .  sertraline (ZOLOFT) 50 MG tablet, TAKE 1 AND 1/2 TABLETS DAILY BY MOUTH .  Vitamin D, Ergocalciferol, (DRISDOL) 1.25 MG (50000 UNIT) CAPS capsule, TAKE 1 CAPSULE (50,000 UNITS TOTAL) BY MOUTH EVERY 7 (SEVEN) DAYS.   Reviewed prior external information including notes and imaging from  primary care provider As well as notes that were available from care everywhere and other healthcare systems.  Past medical history, social,  surgical and family history all reviewed in electronic medical record.  No pertanent information unless stated regarding to the chief complaint.   Review of Systems:  No headache, visual changes, nausea, vomiting, diarrhea, constipation, dizziness, abdominal pain, skin rash, fevers, chills, night sweats, weight loss, swollen lymph nodes, body aches, joint swelling, chest pain, shortness of breath, mood changes. POSITIVE muscle aches  Objective  Blood pressure 100/70, pulse 98, height 5\' 2"  (1.575 m), weight 188 lb (85.3 kg), SpO2 97 %.   General: No apparent distress alert and oriented x3 mood and affect normal, dressed appropriately.  HEENT: Pupils  equal, extraocular movements intact  Respiratory: Patient's speak in full sentences and does not appear short of breath  Cardiovascular: No lower extremity edema, non tender, no erythema  Neuro: Cranial nerves II through XII are intact, neurovascularly intact in all extremities with 2+ DTRs and 2+ pulses.  Gait normal with good balance and coordination.  MSK:  Non tender with full range of motion and good stability and symmetric strength and tone of shoulders, elbows, wrist, hip, knee and ankles bilaterally.  Neck exam does have some mild loss of lordosis, some tender to palpation in the paraspinal musculature of the parascapular spine right greater than left.  Patient has full range of motion of the shoulders bilaterally.  Osteopathic findings C2 flexed rotated and side bent right C4 flexed rotated and side bent left C7 flexed rotated and side bent left T3 extended rotated and side bent right inhaled third rib    Impression and Recommendations:     This case required medical decision making of moderate complexity. The above documentation has been reviewed and is accurate and complete Lyndal Pulley, DO       Note: This dictation was prepared with Dragon dictation along with smaller phrase technology. Any transcriptional errors that result from this process are unintentional.

## 2020-04-10 NOTE — Patient Instructions (Signed)
Great to see you  I am proud of you  Keep it up  See me again in 10-12 weeks

## 2020-04-10 NOTE — Assessment & Plan Note (Signed)
Patient is doing remarkably well.  Patient is responding still very well to osteopathic manipulation.  Follow-up with me again in 43-month intervals.

## 2020-04-10 NOTE — Assessment & Plan Note (Signed)
   Decision today to treat with OMT was based on Physical Exam  After verbal consent patient was treated with HVLA, ME, FPR techniques in cervical, thoracic, rib,areas, all areas are chronic   Patient tolerated the procedure well with improvement in symptoms  Patient given exercises, stretches and lifestyle modifications  See medications in patient instructions if given  Patient will follow up in 4-8 weeks 

## 2020-04-17 ENCOUNTER — Other Ambulatory Visit: Payer: Self-pay | Admitting: Family Medicine

## 2020-05-15 ENCOUNTER — Encounter: Payer: Self-pay | Admitting: Physician Assistant

## 2020-05-15 ENCOUNTER — Ambulatory Visit (INDEPENDENT_AMBULATORY_CARE_PROVIDER_SITE_OTHER): Payer: 59 | Admitting: Physician Assistant

## 2020-05-15 ENCOUNTER — Other Ambulatory Visit: Payer: Self-pay

## 2020-05-15 ENCOUNTER — Ambulatory Visit (INDEPENDENT_AMBULATORY_CARE_PROVIDER_SITE_OTHER)
Admission: RE | Admit: 2020-05-15 | Discharge: 2020-05-15 | Disposition: A | Discharge status: Home / Self Care | Payer: 59 | Source: Ambulatory Visit | Attending: Physician Assistant | Admitting: Physician Assistant

## 2020-05-15 VITALS — BP 112/62 | HR 100 | Temp 98.5°F | Ht 62.0 in | Wt 160.8 lb

## 2020-05-15 DIAGNOSIS — R059 Cough, unspecified: Secondary | ICD-10-CM

## 2020-05-15 DIAGNOSIS — Z720 Tobacco use: Secondary | ICD-10-CM | POA: Diagnosis not present

## 2020-05-15 DIAGNOSIS — R05 Cough: Secondary | ICD-10-CM

## 2020-05-15 MED ORDER — PREDNISONE 20 MG PO TABS: 40.0000 mg | ORAL_TABLET | Freq: Every day | ORAL | 0 refills | Status: DC

## 2020-05-15 NOTE — Patient Instructions (Signed)
It was great to see you!  Start oral prednisone.  I will be in touch with your lab and xray results.  Take care,  Jarold Motto PA-C

## 2020-05-15 NOTE — Progress Notes (Signed)
Diane [name] is a 29 y.o. female here for a new problem.   History of Present Illness:   Chief Complaint  Patient presents with   Cough    HPI   Cough She has had symptoms x 1 month. Has tried Mucinex, Sudafed, Dayquil (all at varying times) with improvement for only a few days. Now having b/l ear fullness and productive cough. She is a smoker, and is currently smoking 1 PPD.   Denies: fever, chills, night sweats, HA, hemoptysis  She has intentionally lost about 30 lb since Thanksgiving.  Has exercise-induced asthma, but does not use a regular inhaler. She has had regular activity, such as hiking, during her cough, and denies SOB out of proportion to her symptoms.  Past Medical History:  Diagnosis Date   Asthma    Vitamin D deficiency      Social History   Tobacco Use   Smoking status: Current Every Day Smoker    Last attempt to quit: 03/16/2018    Years since quitting: 2.1   Smokeless tobacco: Never Used  Vaping Use   Vaping Use: Never used  Substance Use Topics   Alcohol use: Yes    Comment: occ   Drug use: Never    Past Surgical History:  Procedure Laterality Date   TOOTH EXTRACTION      Family History  Problem Relation Age of Onset   Throat cancer Father     No Known Allergies  Current Medications:   Current Outpatient Medications:    norgestimate-ethinyl estradiol (SPRINTEC 28) 0.25-35 MG-MCG tablet, Take 1 tablet by mouth daily., Disp: 84 tablet, Rfl: 3   sertraline (ZOLOFT) 50 MG tablet, TAKE 1 AND 1/2 TABLETS DAILY BY MOUTH, Disp: 135 tablet, Rfl: 1   Vitamin D, Ergocalciferol, (DRISDOL) 1.25 MG (50000 UNIT) CAPS capsule, TAKE 1 CAPSULE (50,000 UNITS TOTAL) BY MOUTH EVERY 7 (SEVEN) DAYS., Disp: 4 capsule, Rfl: 2   predniSONE (DELTASONE) 20 MG tablet, Take 2 tablets (40 mg total) by mouth daily., Disp: 10 tablet, Rfl: 0   Review of Systems:   ROS  Negative unless otherwise specified per HPI.  Vitals:   Vitals:   05/15/20  1500  BP: 112/62  Pulse: 100  Temp: 98.5 F (36.9 C)  TempSrc: Temporal  SpO2: 96%  Weight: 160 lb 12.8 oz (72.9 kg)  Height: 5\' 2"  (1.575 m)     Body mass index is 29.41 kg/m.  Physical Exam:   Physical Exam Vitals and nursing note reviewed.  Constitutional:      General: She is not in acute distress.    Appearance: She is well-developed. She is not ill-appearing or toxic-appearing.  Cardiovascular:     Rate and Rhythm: Normal rate and regular rhythm.     Pulses: Normal pulses.     Heart sounds: Normal heart sounds, S1 normal and S2 normal.     Comments: No LE edema Pulmonary:     Effort: Pulmonary effort is normal.     Breath sounds: Examination of the left-middle field reveals wheezing. Wheezing (slight expiratory wheeze in L lung) present.  Skin:    General: Skin is warm and dry.  Neurological:     Mental Status: She is alert.     GCS: GCS eye subscore is 4. GCS verbal subscore is 5. GCS motor subscore is 6.  Psychiatric:        Speech: Speech normal.        Behavior: Behavior normal. Behavior is cooperative.  Assessment and Plan:   [name] was seen today for cough.  Diagnoses and all orders for this visit:  Cough; Smoker Possible post viral cough. However, given ongoing tobacco use, will check chest xray and also update labs. No obvious source of infection on exam, will await CXR to start abx if indicated. Will also provide a round of oral prednisone to help with her wheezing. May consider scheduled ICS to see if this helps vs pulmonary referral. -     DG Chest 2 View; Future -     CBC with Differential/Platelet; Future -     Comprehensive metabolic panel; Future -     Comprehensive metabolic panel -     CBC with Differential/Platelet  Other orders -     predniSONE (DELTASONE) 20 MG tablet; Take 2 tablets (40 mg total) by mouth daily.  Reviewed expectations re: course of current medical issues. Discussed self-management of symptoms. Outlined  signs and symptoms indicating need for more acute intervention. Patient verbalized understanding and all questions were answered. See orders for this visit as documented in the electronic medical record. Patient received an After-Visit Summary.  CMA or LPN served as scribe during this visit. History, Physical, and Plan performed by medical provider. The above documentation has been reviewed and is accurate and complete.  Diane Coke, PA-C

## 2020-05-16 LAB — COMPREHENSIVE METABOLIC PANEL
ALT: 19 U/L (ref 0–35)
AST: 21 U/L (ref 0–37)
Albumin: 4.7 g/dL (ref 3.5–5.2)
Alkaline Phosphatase: 56 U/L (ref 39–117)
BUN: 6 mg/dL (ref 6–23)
CO2: 25 mEq/L (ref 19–32)
Calcium: 9.5 mg/dL (ref 8.4–10.5)
Chloride: 103 mEq/L (ref 96–112)
Creatinine, Ser: 0.84 mg/dL (ref 0.40–1.20)
GFR: 80.13 mL/min (ref >60.00)
Glucose, Bld: 88 mg/dL (ref 70–99)
Potassium: 3.8 mEq/L (ref 3.5–5.1)
Sodium: 138 mEq/L (ref 135–145)
Total Bilirubin: 0.4 mg/dL (ref 0.2–1.2)
Total Protein: 7.1 g/dL (ref 6.0–8.3)

## 2020-05-16 LAB — CBC WITH DIFFERENTIAL/PLATELET
Basophils Absolute: 0 10*3/uL (ref 0.0–0.1)
Basophils Relative: 0.4 % (ref 0.0–3.0)
Eosinophils Absolute: 0.3 10*3/uL (ref 0.0–0.7)
Eosinophils Relative: 2.4 % (ref 0.0–5.0)
HCT: 40.2 % (ref 36.0–46.0)
Hemoglobin: 13.7 g/dL (ref 12.0–15.0)
Lymphocytes Relative: 20.4 % (ref 12.0–46.0)
Lymphs Abs: 2.1 10*3/uL (ref 0.7–4.0)
MCHC: 34.1 g/dL (ref 30.0–36.0)
MCV: 92.8 fl (ref 78.0–100.0)
Monocytes Absolute: 0.4 10*3/uL (ref 0.1–1.0)
Monocytes Relative: 4.1 % (ref 3.0–12.0)
Neutro Abs: 7.6 10*3/uL (ref 1.4–7.7)
Neutrophils Relative %: 72.7 % (ref 43.0–77.0)
Platelets: 231 10*3/uL (ref 150.0–400.0)
RBC: 4.33 Mil/uL (ref 3.87–5.11)
RDW: 13.1 % (ref 11.5–15.5)
WBC: 10.4 10*3/uL (ref 4.0–10.5)

## 2020-05-21 ENCOUNTER — Other Ambulatory Visit: Payer: Self-pay | Admitting: Physician Assistant

## 2020-05-21 ENCOUNTER — Encounter: Payer: Self-pay | Admitting: Physician Assistant

## 2020-05-21 DIAGNOSIS — Z789 Other specified health status: Secondary | ICD-10-CM

## 2020-05-21 DIAGNOSIS — R059 Cough, unspecified: Secondary | ICD-10-CM

## 2020-05-21 NOTE — Telephone Encounter (Signed)
Please advise 

## 2020-06-10 ENCOUNTER — Other Ambulatory Visit: Payer: Self-pay | Admitting: Physician Assistant

## 2020-06-10 MED ORDER — ALBUTEROL SULFATE HFA 108 (90 BASE) MCG/ACT IN AERS
2.0000 | INHALATION_SPRAY | Freq: Four times a day (QID) | RESPIRATORY_TRACT | 2 refills | Status: DC | PRN
Start: 2020-06-10 — End: 2020-09-07

## 2020-06-10 MED ORDER — FLUTICASONE-SALMETEROL 100-50 MCG/DOSE IN AEPB
1.0000 | INHALATION_SPRAY | Freq: Two times a day (BID) | RESPIRATORY_TRACT | 3 refills | Status: DC
Start: 2020-06-10 — End: 2020-08-31

## 2020-06-20 ENCOUNTER — Ambulatory Visit: Payer: 59 | Admitting: Family Medicine

## 2020-07-01 ENCOUNTER — Other Ambulatory Visit: Payer: Self-pay | Admitting: Family Medicine

## 2020-07-11 ENCOUNTER — Ambulatory Visit: Payer: 59 | Admitting: Family Medicine

## 2020-07-26 ENCOUNTER — Encounter: Payer: Self-pay | Admitting: Emergency Medicine

## 2020-07-26 ENCOUNTER — Other Ambulatory Visit: Payer: Self-pay

## 2020-07-26 ENCOUNTER — Ambulatory Visit (INDEPENDENT_AMBULATORY_CARE_PROVIDER_SITE_OTHER): Payer: 59 | Admitting: Emergency Medicine

## 2020-07-26 DIAGNOSIS — R05 Cough: Secondary | ICD-10-CM | POA: Diagnosis not present

## 2020-07-26 DIAGNOSIS — R053 Chronic cough: Secondary | ICD-10-CM | POA: Insufficient documentation

## 2020-07-26 MED ORDER — BENZONATATE 100 MG PO CAPS
100.0000 mg | ORAL_CAPSULE | Freq: Four times a day (QID) | ORAL | 1 refills | Status: DC | PRN
Start: 2020-07-26 — End: 2020-08-16

## 2020-07-26 MED ORDER — LORATADINE 10 MG PO TABS
10.0000 mg | ORAL_TABLET | Freq: Every day | ORAL | 11 refills | Status: DC
Start: 2020-07-26 — End: 2021-06-10

## 2020-07-26 MED ORDER — FLUTICASONE PROPIONATE 50 MCG/ACT NA SUSP
1.0000 | Freq: Every day | NASAL | 2 refills | Status: DC
Start: 2020-07-26 — End: 2021-04-09

## 2020-07-26 NOTE — Progress Notes (Signed)
Subjective:    Patient ID: Diane [name], female    DOB: November 09, 1991, 29 y.o.   MRN: [id number]  HPI 29 year old active smoker (9 pack years, just started back last May 2020) with a history of asthma that was diagnosed in her early 16's - seemed to be exercise related, cough, SOB. Did seem to respond to albuterol.  She is currently on Advair, has albuterol which she uses approximately. She is referred today for evaluation of cough.  This began around May 2021, productive of clear mucous. No clear precipitant. She did have some nasal congestion at the time. The drainage improved some, the cough persisted. Just had a probable URI that made some of this worse. She has noticed some increased exertional dyspnea. She was given Pred in June, didn't change the cough much. She also started Advair at that time. She may have gotten some cough relief from albuterol. She tried some OTC decongestants without much luck.   No real GERD symptoms.   CXR 05/15/2020 reviewed by me, normal.   Review of Systems As per HPI  Past Medical History:  Diagnosis Date  . Asthma   . Vitamin D deficiency      Family History  Problem Relation Age of Onset  . Throat cancer Father      Social History   Socioeconomic History  . Marital status: Married    Spouse name: Not on file  . Number of children: Not on file  . Years of education: Not on file  . Highest education level: Not on file  Occupational History  . Not on file  Tobacco Use  . Smoking status: Current Every Day Smoker    Packs/day: 1.00    Years: 9.00    Pack years: 9.00    Last attempt to quit: 03/16/2018    Years since quitting: 2.3  . Smokeless tobacco: Never Used  . Tobacco comment: 1 pack smoked daily. ARJ 07/26/20  Vaping Use  . Vaping Use: Never used  Substance and Sexual Activity  . Alcohol use: Yes    Comment: occ  . Drug use: Never  . Sexual activity: Yes  Other Topics Concern  . 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 Difficulty of Paying Living Expenses: Not on file  Food Insecurity:   . Worried About Programme researcher, broadcasting/film/video in the Last Year: Not on file  . Ran Out of Food in the Last Year: Not on file  Transportation Needs:   . Lack of Transportation (Medical): Not on file  . Lack of Transportation (Non-Medical): Not on file  Physical Activity:   . Days of Exercise per Week: Not on file  . Minutes of Exercise per Session: Not on file  Stress:   . Feeling of Stress : Not on file  Social Connections:   . Frequency of Communication with Friends and Family: Not on file  . Frequency of Social Gatherings with Friends and Family: Not on file  . Attends Religious Services: Not on file  . Active Member of Clubs or Organizations: Not on file  . Attends Banker Meetings: Not on file  . Marital Status: Not on file  Intimate Partner Violence:   . Fear of Current or Ex-Partner: Not on file  . Emotionally Abused: Not on file  . Physically Abused: Not on file  .  Sexually Abused: Not on file    Has done office work, now working at home. She has a cat From [place] No inhaled exposures.   No Known Allergies   Outpatient Medications Prior to Visit  Medication Sig Dispense Refill  . albuterol (VENTOLIN HFA) 108 (90 Base) MCG/ACT inhaler Inhale 2 puffs into the lungs every 6 (six) hours as needed for wheezing or shortness of breath. 18 g 2  . Fluticasone-Salmeterol (ADVAIR) 100-50 MCG/DOSE AEPB Inhale 1 puff into the lungs 2 (two) times daily. 1 each 3  . norgestimate-ethinyl estradiol (SPRINTEC 28) 0.25-35 MG-MCG tablet Take 1 tablet by mouth daily. 84 tablet 3  . sertraline (ZOLOFT) 50 MG tablet TAKE 1 AND 1/2 TABLETS DAILY BY MOUTH 135 tablet 1  . Vitamin D, Ergocalciferol, (DRISDOL) 1.25 MG (50000 UNIT) CAPS capsule TAKE 1 CAPSULE BY MOUTH EVERY 7 DAYS. (Patient not taking: Reported on  07/26/2020) 4 capsule 2  . predniSONE (DELTASONE) 20 MG tablet Take 2 tablets (40 mg total) by mouth daily. 10 tablet 0   No facility-administered medications prior to visit.        Objective:   Physical Exam  Vitals:   07/26/20 0955  BP: 120/72  Pulse: 95  Temp: (!) 97.1 F (36.2 C)  TempSrc: Temporal  SpO2: 99%  Weight: 155 lb 12.8 oz (70.7 kg)  Height: 5\' 2"  (1.575 m)   Gen: Pleasant, well-nourished, in no distress,  normal affect  ENT: No lesions,  mouth clear,  oropharynx clear, no postnasal drip  Neck: No JVD, no stridor  Lungs: No use of accessory muscles, some bilateral inspiratory squeaks, no wheezes.   Cardiovascular: RRR, heart sounds normal, no murmur or gallops, no peripheral edema  Musculoskeletal: No deformities, no cyanosis or clubbing  Neuro: alert, awake, non focal  Skin: Warm, no lesions or rash      Assessment & Plan:  Chronic cough Possible asthma component here given her history but this sounds most consistent with upper airway irritation due to increased mucus burden.  Unclear inciting event.  Smoking is probably at least a contributor.  She has not responded significantly to bronchodilators, may sometimes get some relief.  I think for now she should stop the Advair since the powdered formulation may be contributing upper airway irritation. Try to treat allergic rhinitis and mucous.   We will perform full pulmonary function testing at your next office visit. Stop your Advair for now You can keep your albuterol available to use 2 puffs when you need it for shortness of breath, coughing, chest tightness Start loratadine 10 mg (Claritin) once daily until next visit Start fluticasone nasal spray (Flonase), 2 sprays each nostril once daily until next visit Try using Tessalon Perles 200 mg up to every 6 hours if you need it for cough suppression. Work on voice rest Depending on how your cough evolves we may decide to do further testing.  We can  discuss next time. Follow with Dr. next available with full pulmonary function testing on the same day.  Delton Coombes, MD, PhD 07/26/2020, 10:39 AM [facility] [phone] or if no answer [phone]

## 2020-07-26 NOTE — Patient Instructions (Signed)
We will perform full pulmonary function testing at your next office visit. Stop your Advair for now You can keep your albuterol available to use 2 puffs when you need it for shortness of breath, coughing, chest tightness Start loratadine 10 mg (Claritin) once daily until next visit Start fluticasone nasal spray (Flonase), 2 sprays each nostril once daily until next visit Try using Tessalon Perles 200 mg up to every 6 hours if you need it for cough suppression. Work on voice rest Depending on how your cough evolves we may decide to do further testing.  We can discuss next time. Follow with Dr. Delton Coombes next available with full pulmonary function testing on the same day.

## 2020-07-26 NOTE — Assessment & Plan Note (Signed)
Possible asthma component here given her history but this sounds most consistent with upper airway irritation due to increased mucus burden.  Unclear inciting event.  Smoking is probably at least a contributor.  She has not responded significantly to bronchodilators, may sometimes get some relief.  I think for now she should stop the Advair since the powdered formulation may be contributing upper airway irritation. Try to treat allergic rhinitis and mucous.   We will perform full pulmonary function testing at your next office visit. Stop your Advair for now You can keep your albuterol available to use 2 puffs when you need it for shortness of breath, coughing, chest tightness Start loratadine 10 mg (Claritin) once daily until next visit Start fluticasone nasal spray (Flonase), 2 sprays each nostril once daily until next visit Try using Tessalon Perles 200 mg up to every 6 hours if you need it for cough suppression. Work on voice rest Depending on how your cough evolves we may decide to do further testing.  We can discuss next time. Follow with Dr. Delton Coombes next available with full pulmonary function testing on the same day.

## 2020-07-26 NOTE — Addendum Note (Signed)
Addended by: Dorisann Frames R on: 07/26/2020 11:14 AM   Modules accepted: Orders

## 2020-08-09 ENCOUNTER — Ambulatory Visit (INDEPENDENT_AMBULATORY_CARE_PROVIDER_SITE_OTHER): Payer: 59 | Admitting: Family Medicine

## 2020-08-09 ENCOUNTER — Encounter: Payer: Self-pay | Admitting: Family Medicine

## 2020-08-09 ENCOUNTER — Other Ambulatory Visit: Payer: Self-pay

## 2020-08-09 VITALS — BP 126/82 | HR 103 | Ht 62.0 in | Wt 153.0 lb

## 2020-08-09 DIAGNOSIS — M999 Biomechanical lesion, unspecified: Secondary | ICD-10-CM

## 2020-08-09 DIAGNOSIS — S134XXD Sprain of ligaments of cervical spine, subsequent encounter: Secondary | ICD-10-CM | POA: Diagnosis not present

## 2020-08-09 NOTE — Assessment & Plan Note (Signed)
   Decision today to treat with OMT was based on Physical Exam  After verbal consent patient was treated with HVLA, ME, FPR techniques in cervical, thoracic, rib,areas, all areas are chronic   Patient tolerated the procedure well with improvement in symptoms  Patient given exercises, stretches and lifestyle modifications  See medications in patient instructions if given  Patient will follow up in 4-8 weeks 

## 2020-08-09 NOTE — Assessment & Plan Note (Signed)
Patient did have mild exacerbation of the problem.  Has been 4 months since we have seen her.  We will go back in 22-month.  No change in medications follow-up again in 2 months

## 2020-08-09 NOTE — Progress Notes (Signed)
[name] [address] [place] [address] Phone: [phone] Subjective:   I Diane [name] am serving as a Neurosurgeon for Dr. Antoine Primas.  This visit occurred during the SARS-CoV-2 public health emergency.  Safety protocols were in place, including screening questions prior to the visit, additional usage of staff PPE, and extensive cleaning of exam room while observing appropriate contact time as indicated for disinfecting solutions.   I'm seeing this patient by the request  of:  Jarold Motto, [place]  CC: Back pain follow-up  [id number]  Diane [name] is a 29 y.o. female coming in with complaint of back pain. Last seen 04/10/2020 for omt. Back is painful today.  Patient states that the back in the neck is a little tighter than usual.  Has been 4 months.  States that the other day went to pick up a box and had some increase in tightness.  No radicular symptoms.  Did not need anything other than some mild over-the-counter medicines.  Uncomfortable at night denies any radiation of the pain.  Neck still has some mild tightness but nothing severe     Past Medical History:  Diagnosis Date  . Asthma   . Vitamin D deficiency    Past Surgical History:  Procedure Laterality Date  . TOOTH EXTRACTION     Social History   Socioeconomic History  . Marital status: Married    Spouse name: Not on file  . Number of children: Not on file  . Years of education: Not on file  . Highest education level: Not on file  Occupational History  . Not on file  Tobacco Use  . Smoking status: Current Every Day Smoker    Packs/day: 1.00    Years: 9.00    Pack years: 9.00    Last attempt to quit: 03/16/2018    Years since quitting: 2.4  . Smokeless tobacco: Never Used  . Tobacco comment: 1 pack smoked daily. ARJ 07/26/20  Vaping Use  . Vaping Use: Never used  Substance and Sexual Activity  . Alcohol use: Yes    Comment: occ  . Drug use: Never  . Sexual  activity: Yes  Other Topics Concern  . 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 Difficulty of Paying Living Expenses: Not on file  Food Insecurity:   . Worried About Programme researcher, broadcasting/film/video in the Last Year: Not on file  . Ran Out of Food in the Last Year: Not on file  Transportation Needs:   . Lack of Transportation (Medical): Not on file  . Lack of Transportation (Non-Medical): Not on file  Physical Activity:   . Days of Exercise per Week: Not on file  . Minutes of Exercise per Session: Not on file  Stress:   . Feeling of Stress : Not on file  Social Connections:   . Frequency of Communication with Friends and Family: Not on file  . Frequency of Social Gatherings with Friends and Family: Not on file  . Attends Religious Services: Not on file  . Active Member of Clubs or Organizations: Not on file  . Attends Banker Meetings: Not on file  . Marital Status: Not on file   No Known Allergies Family History  Problem Relation Age of Onset  . Throat cancer Father     Current Outpatient  Medications (Endocrine & Metabolic):  .  norgestimate-ethinyl estradiol (SPRINTEC 28) 0.25-35 MG-MCG tablet, Take 1 tablet by mouth daily.   Current Outpatient Medications (Respiratory):  .  albuterol (VENTOLIN HFA) 108 (90 Base) MCG/ACT inhaler, Inhale 2 puffs into the lungs every 6 (six) hours as needed for wheezing or shortness of breath. .  benzonatate (TESSALON) 100 MG capsule, Take 1 capsule (100 mg total) by mouth every 6 (six) hours as needed for cough. .  fluticasone (FLONASE) 50 MCG/ACT nasal spray, Place 1 spray into both nostrils daily. .  Fluticasone-Salmeterol (ADVAIR) 100-50 MCG/DOSE AEPB, Inhale 1 puff into the lungs 2 (two) times daily. Marland Kitchen  loratadine (CLARITIN) 10 MG tablet, Take 1 tablet (10 mg total) by mouth daily.    Current Outpatient  Medications (Other):  .  sertraline (ZOLOFT) 50 MG tablet, TAKE 1 AND 1/2 TABLETS DAILY BY MOUTH .  Vitamin D, Ergocalciferol, (DRISDOL) 1.25 MG (50000 UNIT) CAPS capsule, TAKE 1 CAPSULE BY MOUTH EVERY 7 DAYS.   Reviewed prior external information including notes and imaging from  primary care provider As well as notes that were available from care everywhere and other healthcare systems.  Past medical history, social, surgical and family history all reviewed in electronic medical record.  No pertanent information unless stated regarding to the chief complaint.   Review of Systems:  No headache, visual changes, nausea, vomiting, diarrhea, constipation, dizziness, abdominal pain, skin rash, fevers, chills, night sweats, weight loss, swollen lymph nodes, body aches, joint swelling, chest pain, shortness of breath, mood changes. POSITIVE muscle aches  Objective  Blood pressure 126/82, pulse (!) 103, height 5\' 2"  (1.575 m), weight 153 lb (69.4 kg), SpO2 97 %.   General: No apparent distress alert and oriented x3 mood and affect normal, dressed appropriately.  HEENT: Pupils equal, extraocular movements intact  Respiratory: Patient's speak in full sentences and does not appear short of breath  Cardiovascular: No lower extremity edema, non tender, no erythema  Neuro: Cranial nerves II through XII are intact, neurovascularly intact in all extremities with 2+ DTRs and 2+ pulses.  Gait normal with good balance and coordination.  MSK:  Non tender with full range of motion and good stability and symmetric strength and tone of shoulders, elbows, wrist, hip, knee and ankles bilaterally.  Neck exam does have more tightness in the parascapular region, patient does have near full range of motion. Negative Spurling's.  Does have some tightness noted in the paraspinal musculature, tightness in the finger.  Osteopathic findings C4 flexed rotated and side bent left T3 extended rotated and side bent right  inhaled third rib T9 extended rotated and side bent left      Impression and Recommendations:     The above documentation has been reviewed and is accurate and complete , DO       Note: This dictation was prepared with Dragon dictation along with smaller phrase technology. Any transcriptional errors that result from this process are unintentional.

## 2020-08-09 NOTE — Patient Instructions (Addendum)
Good to see you See me again in 2 months 

## 2020-08-14 ENCOUNTER — Other Ambulatory Visit: Payer: Self-pay | Admitting: Emergency Medicine

## 2020-08-14 NOTE — Telephone Encounter (Signed)
Dr. Delton Coombes, please advise if you are okay with [place] refilling med for pt.

## 2020-08-16 MED ORDER — BENZONATATE 100 MG PO CAPS: 100.0000 mg | ORAL_CAPSULE | Freq: Four times a day (QID) | ORAL | 1 refills | Status: DC | PRN

## 2020-08-31 ENCOUNTER — Other Ambulatory Visit: Payer: Self-pay

## 2020-08-31 ENCOUNTER — Ambulatory Visit (INDEPENDENT_AMBULATORY_CARE_PROVIDER_SITE_OTHER): Payer: 59 | Admitting: Emergency Medicine

## 2020-08-31 ENCOUNTER — Encounter: Payer: Self-pay | Admitting: Emergency Medicine

## 2020-08-31 DIAGNOSIS — R053 Chronic cough: Secondary | ICD-10-CM | POA: Diagnosis not present

## 2020-08-31 LAB — PULMONARY FUNCTION TEST
DL/VA % pred: 108 %
DL/VA: 5.06 ml/min/mmHg/L
DLCO cor % pred: 115 %
DLCO cor: 24.65 ml/min/mmHg
DLCO unc % pred: 115 %
DLCO unc: 24.65 ml/min/mmHg
FEF 25-75 Post: 3.45 L/sec
FEF 25-75 Pre: 3.4 L/sec
FEF2575-%Change-Post: 1 %
FEF2575-%Pred-Post: 101 %
FEF2575-%Pred-Pre: 99 %
FEV1-%Change-Post: 2 %
FEV1-%Pred-Post: 110 %
FEV1-%Pred-Pre: 108 %
FEV1-Post: 3.39 L
FEV1-Pre: 3.32 L
FEV1FVC-%Change-Post: 0 %
FEV1FVC-%Pred-Pre: 99 %
FEV6-%Change-Post: 1 %
FEV6-%Pred-Post: 111 %
FEV6-%Pred-Pre: 110 %
FEV6-Post: 3.98 L
FEV6-Pre: 3.94 L
FEV6FVC-%Pred-Post: 100 %
FEV6FVC-%Pred-Pre: 100 %
FVC-%Change-Post: 1 %
FVC-%Pred-Post: 110 %
FVC-%Pred-Pre: 109 %
FVC-Post: 3.98 L
FVC-Pre: 3.94 L
Post FEV1/FVC ratio: 85 %
Post FEV6/FVC ratio: 100 %
Pre FEV1/FVC ratio: 84 %
Pre FEV6/FVC Ratio: 100 %
RV % pred: 62 %
RV: 0.81 L
TLC % pred: 95 %
TLC: 4.63 L

## 2020-08-31 MED ORDER — BENZONATATE 100 MG PO CAPS: 100.0000 mg | ORAL_CAPSULE | Freq: Four times a day (QID) | ORAL | 1 refills | Status: DC | PRN

## 2020-08-31 MED ORDER — PREDNISONE 10 MG PO TABS: 30.0000 mg | ORAL_TABLET | Freq: Every day | ORAL | 0 refills | Status: AC

## 2020-08-31 MED ORDER — PANTOPRAZOLE SODIUM 40 MG PO TBEC: 40.0000 mg | DELAYED_RELEASE_TABLET | Freq: Every day | ORAL | 5 refills | Status: DC

## 2020-08-31 MED ORDER — AZITHROMYCIN 250 MG PO TABS: ORAL_TABLET | ORAL | 0 refills | Status: DC

## 2020-08-31 NOTE — Progress Notes (Signed)
Subjective:    Patient ID: Diane [name], female    DOB: 1991/06/15, 29 y.o.   MRN: [id number]   HPI 29 year old active smoker (9 pack years, just started back last May 2020) with a history of asthma that was diagnosed in her early 2's - seemed to be exercise related, cough, SOB. Did seem to respond to albuterol.  She is currently on Advair, has albuterol which she uses approximately. She is referred today for evaluation of cough.  This began around May 2021, productive of clear mucous. No clear precipitant. She did have some nasal congestion at the time. The drainage improved some, the cough persisted. Just had a probable URI that made some of this worse. She has noticed some increased exertional dyspnea. She was given Pred in June, didn't change the cough much. She also started Advair at that time. She may have gotten some cough relief from albuterol. She tried some OTC decongestants without much luck.  No real GERD symptoms.   CXR 05/15/2020 reviewed by me, normal.    ROV 08/31/20 --follow-up visit for 29 year old woman who carries a history of asthma diagnosed in her early 76s, possibly exercise-induced.  Seen for cough last month.  May be precipitated by URI, seems to be associated with nasal congestion and drainage.  We tried stopping her Advair as a possible upper airway irritant, started loratadine and fluticasone nasal spray, tried voice rest and Tessalon Perles for cough suppression. Her nasal congestion is better, still coughing up clear mucous. Losing her voice and having throat soreness.   She underwent pulmonary function testing today which shows normal airflows without a bronchodilator response and normal flow volume loop.  Her total lung capacity is normal, slightly decreased RV suggestive of possible restriction.  Her diffusion capacity is normal.   Review of Systems As per HPI  Past Medical History:  Diagnosis Date  . Asthma   . Vitamin D deficiency      Family History    Problem Relation Age of Onset  . Throat cancer Father      Social History   Socioeconomic History  . Marital status: Married    Spouse name: Not on file  . Number of children: Not on file  . Years of education: Not on file  . Highest education level: Not on file  Occupational History  . Not on file  Tobacco Use  . Smoking status: Current Every Day Smoker    Packs/day: 1.00    Years: 9.00    Pack years: 9.00    Last attempt to quit: 03/16/2018    Years since quitting: 2.4  . Smokeless tobacco: Never Used  . Tobacco comment: 1 pack smoked daily. ARJ 08/31/20  Vaping Use  . Vaping Use: Never used  Substance and Sexual Activity  . Alcohol use: Yes    Comment: occ  . Drug use: Never  . Sexual activity: Yes  Other Topics Concern  . 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 Difficulty of Paying Living Expenses: Not on file  Food Insecurity:   . Worried About Programme researcher, broadcasting/film/video in the Last Year: Not on file  . Ran Out of Food in the Last Year: Not on file  Transportation Needs:   . Lack of Transportation (Medical): Not on file  . Lack of Transportation (Non-Medical): Not on file  Physical Activity:   . Days of Exercise per Week: Not on file  . Minutes of Exercise per Session: Not on file  Stress:   . Feeling of Stress : Not on file  Social Connections:   . Frequency of Communication with Friends and Family: Not on file  . Frequency of Social Gatherings with Friends and Family: Not on file  . Attends Religious Services: Not on file  . Active Member of Clubs or Organizations: Not on file  . Attends Banker Meetings: Not on file  . Marital Status: Not on file  Intimate Partner Violence:   . Fear of Current or Ex-Partner: Not on file  . Emotionally Abused: Not on file  . Physically Abused: Not on file  . Sexually Abused: Not on file     Has done office work, now working at home. She has a cat From [place] No inhaled exposures.   No Known Allergies   Outpatient Medications Prior to Visit  Medication Sig Dispense Refill  . albuterol (VENTOLIN HFA) 108 (90 Base) MCG/ACT inhaler Inhale 2 puffs into the lungs every 6 (six) hours as needed for wheezing or shortness of breath. 18 g 2  . fluticasone (FLONASE) 50 MCG/ACT nasal spray Place 1 spray into both nostrils daily. 16 g 2  . loratadine (CLARITIN) 10 MG tablet Take 1 tablet (10 mg total) by mouth daily. 30 tablet 11  . norgestimate-ethinyl estradiol (SPRINTEC 28) 0.25-35 MG-MCG tablet Take 1 tablet by mouth daily. 84 tablet 3  . sertraline (ZOLOFT) 50 MG tablet TAKE 1 AND 1/2 TABLETS DAILY BY MOUTH 135 tablet 1  . Vitamin D, Ergocalciferol, (DRISDOL) 1.25 MG (50000 UNIT) CAPS capsule TAKE 1 CAPSULE BY MOUTH EVERY 7 DAYS. 4 capsule 2  . benzonatate (TESSALON) 100 MG capsule Take 1 capsule (100 mg total) by mouth every 6 (six) hours as needed for cough. 30 capsule 1  . Fluticasone-Salmeterol (ADVAIR) 100-50 MCG/DOSE AEPB Inhale 1 puff into the lungs 2 (two) times daily. (Patient not taking: Reported on 08/31/2020) 1 each 3   No facility-administered medications prior to visit.        Objective:   Physical Exam  Vitals:   08/31/20 1202  BP: 112/68  Pulse: (!) 114  Temp: 97.6 F (36.4 C)  TempSrc: Temporal  SpO2: 97%  Weight: 155 lb (70.3 kg)  Height: 5\' 2"  (1.575 m)   Gen: Pleasant, well-nourished, in no distress,  normal affect  ENT: No lesions,  mouth clear,  oropharynx clear, no postnasal drip  Neck: No JVD, no stridor  Lungs: No use of accessory muscles, some bilateral inspiratory squeaks, no wheezes.   Cardiovascular: RRR, heart sounds normal, no murmur or gallops, no peripheral edema  Musculoskeletal: No deformities, no cyanosis or clubbing  Neuro: alert, awake, non focal  Skin: Warm, no lesions or rash      Assessment & Plan:  Chronic  cough Spirometry and PFT today are normal.  No evidence of obstruction, no curve to the flow volume loop.  No bronchodilator responsiveness.  She may have situational, exercise-induced asthma but her airflows at rest are normal.  Do not believe that bronchospasm is a significant contributor to her cough syndrome, suspect that this is upper airway irritation.  She tolerated stopping Advair without any difficulty.  She aggressively treated allergic rhinitis but the cough has not really changed.  I think we should continue this, try to add GERD therapy empirically to see if this impacts her cough.  I  will also treat her with prednisone and azithromycin to try to calm active inflammation, hopefully reset her airway to baseline.  Cough suppression and avoiding throat clearing discussed.  If she continues to cough after about 3 weeks she will call me.  If that is the case then I think she needs bronchoscopy, could consider CT imaging.  Please continue loratadine 10 mg once daily Please continue fluticasone nasal spray, 2 sprays each nostril once daily Start pantoprazole 40 mg once a day.  Take this medication 1 hour around food. Take prednisone 30 mg once daily for 7 days and then stop Take azithromycin as prescribed until completely gone Use Tessalon Perles 200 mg up to every 6 hours if needed for cough suppression Use Hycodan cough syrup 5 cc up to every 6 hours if needed for cough suppression.  Be careful because this medication can make you sleepy.  Caution with driving and operating machinery Try to avoid throat clearing if at all possible Call our office in about 3 weeks to let us know how your cough is doing.  If you continue to have cough then we should consider performing bronchoscopy to inspect your throat and airways. Follow with Dr. Delton Coombes in 2 months or sooner if you have any problems.   Levy Pupa, MD, PhD 08/31/2020, 1:43 PM [facility] [phone] or if no answer  [phone]

## 2020-08-31 NOTE — Patient Instructions (Signed)
Please continue loratadine 10 mg once daily Please continue fluticasone nasal spray, 2 sprays each nostril once daily Start pantoprazole 40 mg once a day.  Take this medication 1 hour around food. Take prednisone 30 mg once daily for 7 days and then stop Take azithromycin as prescribed until completely gone Use Tessalon Perles 200 mg up to every 6 hours if needed for cough suppression Use Hycodan cough syrup 5 cc up to every 6 hours if needed for cough suppression.  Be careful because this medication can make you sleepy.  Caution with driving and operating machinery Try to avoid throat clearing if at all possible Call our office in about 3 weeks to let us know how your cough is doing.  If you continue to have cough then we should consider performing bronchoscopy to inspect your throat and airways. Follow with Dr. Delton Coombes in 2 months or sooner if you have any problems.

## 2020-08-31 NOTE — Assessment & Plan Note (Addendum)
Spirometry and PFT today are normal.  No evidence of obstruction, no curve to the flow volume loop.  No bronchodilator responsiveness.  She may have situational, exercise-induced asthma but her airflows at rest are normal.  Do not believe that bronchospasm is a significant contributor to her cough syndrome, suspect that this is upper airway irritation.  She tolerated stopping Advair without any difficulty.  She aggressively treated allergic rhinitis but the cough has not really changed.  I think we should continue this, try to add GERD therapy empirically to see if this impacts her cough.  I will also treat her with prednisone and azithromycin to try to calm active inflammation, hopefully reset her airway to baseline.  Cough suppression and avoiding throat clearing discussed.  If she continues to cough after about 3 weeks she will call me.  If that is the case then I think she needs bronchoscopy, could consider CT imaging.  Please continue loratadine 10 mg once daily Please continue fluticasone nasal spray, 2 sprays each nostril once daily Start pantoprazole 40 mg once a day.  Take this medication 1 hour around food. Take prednisone 30 mg once daily for 7 days and then stop Take azithromycin as prescribed until completely gone Use Tessalon Perles 200 mg up to every 6 hours if needed for cough suppression Use Hycodan cough syrup 5 cc up to every 6 hours if needed for cough suppression.  Be careful because this medication can make you sleepy.  Caution with driving and operating machinery Try to avoid throat clearing if at all possible Call our office in about 3 weeks to let us know how your cough is doing.  If you continue to have cough then we should consider performing bronchoscopy to inspect your throat and airways. Follow with Dr. Delton Coombes in 2 months or sooner if you have any problems.

## 2020-08-31 NOTE — Progress Notes (Signed)
Full PFT performed today. °

## 2020-08-31 NOTE — Patient Instructions (Signed)
Full PFT performed today. °

## 2020-09-07 ENCOUNTER — Other Ambulatory Visit: Payer: Self-pay | Admitting: Physician Assistant

## 2020-10-06 ENCOUNTER — Other Ambulatory Visit: Payer: Self-pay | Admitting: Physician Assistant

## 2020-10-09 NOTE — Progress Notes (Signed)
  [name] [address] [place] [address] Phone: [phone] Subjective:    I'm seeing this patient by the request  of:  Jarold Motto, [place]  CC: Low back and neck pain follow-up  [id number]  Diane [name] is a 29 y.o. female coming in with complaint of back and neck pain. OMT 08/09/2020. Patient states her right hip is painful.  Has been having more tightness of the low back.  Has been sitting more frequently.  Patient states still having some mild back pain and was doing relatively well.  Medications patient has been prescribed: Vit D         Reviewed prior external information including notes and imaging from previsou exam, outside providers and external EMR if available.   As well as notes that were available from care everywhere and other healthcare systems.  Past medical history, social, surgical and family history all reviewed in electronic medical record.  No pertanent information unless stated regarding to the chief complaint.   Past Medical History:  Diagnosis Date  . Asthma   . Vitamin D deficiency     No Known Allergies   Review of Systems:  No headache, visual changes, nausea, vomiting, diarrhea, constipation, dizziness, abdominal pain, skin rash, fevers, chills, night sweats, weight loss, swollen lymph nodes, body aches, joint swelling, chest pain, shortness of breath, mood changes. POSITIVE muscle aches  Objective  Blood pressure 118/80, pulse (!) 102, height 5\' 2"  (1.575 m), weight 159 lb (72.1 kg), SpO2 98 %.   General: No apparent distress alert and oriented x3 mood and affect normal, dressed appropriately.  HEENT: Pupils equal, extraocular movements intact  Respiratory: Patient's speak in full sentences and does not appear short of breath  Cardiovascular: No lower extremity edema, non tender, no erythema  Neuro: Cranial nerves II through XII are intact, neurovascularly intact in all extremities with 2+ DTRs  and 2+ pulses.  Gait normal with good balance and coordination.  MSK:  Non tender with full range of motion and good stability and symmetric strength and tone of shoulders, elbows, wrist, hip, knee and ankles bilaterally.  Back -back exam shows the patient does have significant tightness noted of the right hip flexor compared to the contralateral side.  Some tightness noted in the thoracolumbar juncture.  Negative straight leg test.  Osteopathic findings  C6 flexed rotated and side bent left T3 extended rotated and side bent right inhaled rib T9 extended rotated and side bent left L1 flexed rotated and side bent right Sacrum right on right       Assessment and Plan:    Nonallopathic problems  Decision today to treat with OMT was based on Physical Exam  After verbal consent patient was treated with HVLA, ME, FPR techniques in cervical, rib, thoracic, lumbar, and sacral  areas  Patient tolerated the procedure well with improvement in symptoms  Patient given exercises, stretches and lifestyle modifications  See medications in patient instructions if given  Patient will follow up in 4-8 weeks      The above documentation has been reviewed and is accurate and complete , DO       Note: This dictation was prepared with Dragon dictation along with smaller phrase technology. Any transcriptional errors that result from this process are unintentional.

## 2020-10-10 ENCOUNTER — Ambulatory Visit (INDEPENDENT_AMBULATORY_CARE_PROVIDER_SITE_OTHER): Payer: 59 | Admitting: Family Medicine

## 2020-10-10 ENCOUNTER — Encounter: Payer: Self-pay | Admitting: Family Medicine

## 2020-10-10 ENCOUNTER — Other Ambulatory Visit: Payer: Self-pay

## 2020-10-10 VITALS — BP 118/80 | HR 102 | Ht 62.0 in | Wt 159.0 lb

## 2020-10-10 DIAGNOSIS — M24551 Contracture, right hip: Secondary | ICD-10-CM | POA: Diagnosis not present

## 2020-10-10 DIAGNOSIS — M999 Biomechanical lesion, unspecified: Secondary | ICD-10-CM

## 2020-10-10 NOTE — Assessment & Plan Note (Signed)
Tightness of the hip flexors noted.  Patient seems to be more on the right side.  Patient has been sleeping more at work and likely contributing to some of it as well.  Discussed icing regimen and home exercises, increase activity slowly.  Follow-up again in 8 weeks

## 2020-10-10 NOTE — Patient Instructions (Signed)
Good to see you You are awesome!!! Figure out Thanksgiving See me again in 2 months

## 2020-10-14 ENCOUNTER — Other Ambulatory Visit: Payer: Self-pay | Admitting: Emergency Medicine

## 2020-10-15 NOTE — Telephone Encounter (Signed)
Pt is requesting refill on BENZONATATE 100 MG CAPSULE lov 08/31/20

## 2020-11-03 ENCOUNTER — Other Ambulatory Visit: Payer: Self-pay | Admitting: Physician Assistant
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
## 2020-12-10 ENCOUNTER — Other Ambulatory Visit: Payer: Self-pay

## 2020-12-10 ENCOUNTER — Ambulatory Visit (INDEPENDENT_AMBULATORY_CARE_PROVIDER_SITE_OTHER): Payer: 59 | Admitting: Family Medicine

## 2020-12-10 ENCOUNTER — Encounter: Payer: Self-pay | Admitting: Family Medicine

## 2020-12-10 VITALS — BP 138/90 | HR 89 | Ht 62.0 in | Wt 166.0 lb

## 2020-12-10 DIAGNOSIS — S134XXD Sprain of ligaments of cervical spine, subsequent encounter: Secondary | ICD-10-CM | POA: Diagnosis not present

## 2020-12-10 DIAGNOSIS — M999 Biomechanical lesion, unspecified: Secondary | ICD-10-CM | POA: Diagnosis not present

## 2020-12-10 NOTE — Assessment & Plan Note (Signed)
Patient overall seems to be doing relatively well.  Does feel like this has made significant improvement though.  Patient is not taking anything for pain at this time.  Discussed posture and ergonomics.  Patient will follow up with me again in 2 to 3 months

## 2020-12-10 NOTE — Patient Instructions (Signed)
Good to see you Thanks for bringing good energy Keep doing the exercises  See me again in 2-3 months

## 2021-01-10 ENCOUNTER — Encounter: Payer: Self-pay | Admitting: Physician Assistant

## 2021-02-13 ENCOUNTER — Ambulatory Visit: Payer: 59 | Admitting: Family Medicine

## 2021-03-04 NOTE — Progress Notes (Signed)
[name] [address] [place] [address] Phone: [phone] Subjective:   I Diane [name] am serving as a Neurosurgeon for Dr. Antoine Primas.  This visit occurred during the SARS-CoV-2 public health emergency.  Safety protocols were in place, including screening questions prior to the visit, additional usage of staff PPE, and extensive cleaning of exam room while observing appropriate contact time as indicated for disinfecting solutions.   I'm seeing this patient by the request  of:  Jarold Motto, [place]  CC: Low back pain follow-up  [id number]  Diane [name] is a 30 y.o. female coming in with complaint of back and neck pain. OMT 12/10/2020. Patient states she is doing well. States her pain has been worse.  Medications patient has been prescribed: Vit D  Taking: Yes         Reviewed prior external information including notes and imaging from previsou exam, outside providers and external EMR if available.   As well as notes that were available from care everywhere and other healthcare systems.  Past medical history, social, surgical and family history all reviewed in electronic medical record.  No pertanent information unless stated regarding to the chief complaint.   Past Medical History:  Diagnosis Date  . Asthma   . Vitamin D deficiency     No Known Allergies   Review of Systems:  No headache, visual changes, nausea, vomiting, diarrhea, constipation, dizziness, abdominal pain, skin rash, fevers, chills, night sweats, weight loss, swollen lymph nodes, body aches, joint swelling, chest pain, shortness of breath, mood changes. POSITIVE muscle aches  Objective  Blood pressure 124/80, pulse 99, height 5\' 2"  (1.575 m), weight 168 lb (76.2 kg), SpO2 98 %.   General: No apparent distress alert and oriented x3 mood and affect normal, dressed appropriately.  HEENT: Pupils equal, extraocular movements intact  Respiratory: Patient's speak  in full sentences and does not appear short of breath  Cardiovascular: No lower extremity edema, non tender, no erythema   Gait normal with good balance and coordination.  MSK:  Non tender with full range of motion and good stability and symmetric strength and tone of shoulders, elbows, wrist, hip, knee and ankles bilaterally.  Back -significant tightness noted in the thoracolumbar juncture right greater than left.  Mild muscle spasm noted in this area.  Patient does have tightness of the right hip compared to contralateral side.  Osteopathic findings  C5 flexed rotated and side bent right T3 extended rotated and side bent right inhaled rib T7 extended rotated and side bent left L2 flexed rotated and side bent right Sacrum right on right       Assessment and Plan:  Hip flexor tightness, right continued tightness of hip flexors noted today again.  Patient did respond fairly well to osteopathic manipulation but did have some mild spasm noted at the thoracolumbar juncture at the origin of the hip flexor.  Discussed icing regimen and home exercises.  Discussed core strength which patient has done relatively well overall.  Discussed avoiding certain activities.  Patient given new hip flexor exercises and shown proper form.  Due to the exacerbation we did send in a muscle relaxer to help her with this as well as possibly help her with some sleep the patient has had difficulty with.  Follow-up with me again in 4 weeks.    Nonallopathic problems  Decision today to treat with OMT was based on Physical Exam  After verbal consent patient was treated with HVLA, ME,  FPR techniques in cervical, rib, thoracic, lumbar, and sacral  areas  Patient tolerated the procedure well with improvement in symptoms  Patient given exercises, stretches and lifestyle modifications  See medications in patient instructions if given  Patient will follow up in 4-8 weeks      The above documentation has been  reviewed and is accurate and complete Judi Saa, DO       Note: This dictation was prepared with Dragon dictation along with smaller phrase technology. Any transcriptional errors that result from this process are unintentional.

## 2021-03-05 ENCOUNTER — Other Ambulatory Visit: Payer: Self-pay

## 2021-03-05 ENCOUNTER — Other Ambulatory Visit: Payer: Self-pay | Admitting: Emergency Medicine

## 2021-03-05 ENCOUNTER — Encounter: Payer: Self-pay | Admitting: Family Medicine

## 2021-03-05 ENCOUNTER — Ambulatory Visit (INDEPENDENT_AMBULATORY_CARE_PROVIDER_SITE_OTHER): Payer: 59 | Admitting: Family Medicine

## 2021-03-05 VITALS — BP 124/80 | HR 99 | Ht 62.0 in | Wt 168.0 lb

## 2021-03-05 DIAGNOSIS — M24551 Contracture, right hip: Secondary | ICD-10-CM

## 2021-03-05 DIAGNOSIS — M999 Biomechanical lesion, unspecified: Secondary | ICD-10-CM

## 2021-03-05 MED ORDER — TIZANIDINE HCL 2 MG PO TABS: 2.0000 mg | ORAL_TABLET | Freq: Every day | ORAL | 0 refills | Status: DC

## 2021-03-05 NOTE — Patient Instructions (Addendum)
Good to see you Zanaflex 2 mg at night as needed 20 Do the new hip flexor exercise that you are bad at  See me again in 4 weeks

## 2021-03-05 NOTE — Assessment & Plan Note (Addendum)
continued tightness of hip flexors noted today again.  Patient did respond fairly well to osteopathic manipulation but did have some mild spasm noted at the thoracolumbar juncture at the origin of the hip flexor.  Discussed icing regimen and home exercises.  Discussed core strength which patient has done relatively well overall.  Discussed avoiding certain activities.  Patient given new hip flexor exercises and shown proper form.  Due to the exacerbation we did send in a muscle relaxer to help her with this as well as possibly help her with some sleep the patient has had difficulty with.  Follow-up with me again in 4 weeks.  At follow-up we will consider back and pelvic x-rays.

## 2021-03-27 ENCOUNTER — Other Ambulatory Visit: Payer: Self-pay | Admitting: Family Medicine

## 2021-04-03 ENCOUNTER — Other Ambulatory Visit: Payer: Self-pay

## 2021-04-03 ENCOUNTER — Ambulatory Visit (INDEPENDENT_AMBULATORY_CARE_PROVIDER_SITE_OTHER): Payer: 59 | Admitting: Family Medicine

## 2021-04-03 ENCOUNTER — Encounter: Payer: Self-pay | Admitting: Family Medicine

## 2021-04-03 VITALS — BP 120/70 | HR 106 | Ht 62.0 in | Wt 165.0 lb

## 2021-04-03 DIAGNOSIS — M9902 Segmental and somatic dysfunction of thoracic region: Secondary | ICD-10-CM

## 2021-04-03 DIAGNOSIS — M24551 Contracture, right hip: Secondary | ICD-10-CM

## 2021-04-03 DIAGNOSIS — M9903 Segmental and somatic dysfunction of lumbar region: Secondary | ICD-10-CM

## 2021-04-03 DIAGNOSIS — M9908 Segmental and somatic dysfunction of rib cage: Secondary | ICD-10-CM

## 2021-04-03 DIAGNOSIS — M9904 Segmental and somatic dysfunction of sacral region: Secondary | ICD-10-CM

## 2021-04-03 DIAGNOSIS — M9901 Segmental and somatic dysfunction of cervical region: Secondary | ICD-10-CM | POA: Diagnosis not present

## 2021-04-03 NOTE — Patient Instructions (Signed)
Good to see you Thanks for making me laugh Good luck with DND Happy birthday! See me again in 6 weeks

## 2021-04-03 NOTE — Progress Notes (Signed)
[name] [address] [place] [address] Phone: [phone] Subjective:   I Diane [name] am serving as a Neurosurgeon for Dr. Antoine Primas.  This visit occurred during the SARS-CoV-2 public health emergency.  Safety protocols were in place, including screening questions prior to the visit, additional usage of staff PPE, and extensive cleaning of exam room while observing appropriate contact time as indicated for disinfecting solutions.   I'm seeing this patient by the request  of:  Jarold Motto, [place]  CC: Low back pain follow-up  [id number]  Kalisa Girtman is a 30 y.o. female coming in with complaint of back and neck pain. OMT 03/05/2021. Patient states she is doing well today. Has been taking zanaflex and state it is doing better.  Patient denies any worsening of any other symptoms.  Patient feels like she is making some progress.  Trying to stay fairly active.  Medications patient has been prescribed: Zanaflex          Reviewed prior external information including notes and imaging from previsou exam, outside providers and external EMR if available.   As well as notes that were available from care everywhere and other healthcare systems.  Past medical history, social, surgical and family history all reviewed in electronic medical record.  No pertanent information unless stated regarding to the chief complaint.   Past Medical History:  Diagnosis Date  . Asthma   . Vitamin D deficiency     No Known Allergies   Review of Systems:  No headache, visual changes, nausea, vomiting, diarrhea, constipation, dizziness, abdominal pain, skin rash, fevers, chills, night sweats, weight loss, swollen lymph nodes, body aches, joint swelling, chest pain, shortness of breath, mood changes. POSITIVE muscle aches but mild  Objective  Blood pressure 120/70, pulse (!) 106, height 5\' 2"  (1.575 m), weight 165 lb (74.8 kg), SpO2 98 %.   General: No  apparent distress alert and oriented x3 mood and affect normal, dressed appropriately.  HEENT: Pupils equal, extraocular movements intact  Respiratory: Patient's speak in full sentences and does not appear short of breath  Cardiovascular: No lower extremity edema, non tender, no erythema  Gait normal with good balance and coordination.  MSK:  Non tender with full range of motion and good stability and symmetric strength and tone of shoulders, elbows, wrist, hip, knee and ankles bilaterally.  Back -patient does have tightness noted in the parascapular region on the left side.  Patient also has a some tightness noted in the thoracolumbar juncture on the right side.  Negative straight leg test.  Osteopathic findings  C4 flexed rotated and side bent right T3 extended rotated and side bent left inhaled rib L3 flexed rotated and side bent right Sacrum right on right       Assessment and Plan:  Hip flexor tightness, right Will continue mild tightness.  Symptoms more tightness likely thoracolumbar juncture as well.  Discussed with patient about icing regimen and home exercises, with activities to do with complete void.  Increase activity slowly.  Patient presents with improvement we did discuss the possibility of ergonomics.  Follow-up with me again 6 weeks    Nonallopathic problems  Decision today to treat with OMT was based on Physical Exam  After verbal consent patient was treated with HVLA, ME, FPR techniques in cervical, rib, thoracic, lumbar, and sacral  areas  Patient tolerated the procedure well with improvement in symptoms  Patient given exercises, stretches and lifestyle modifications  See medications in  patient instructions if given  Patient will follow up in 4-8 weeks      The above documentation has been reviewed and is accurate and complete Judi Saa, DO       Note: This dictation was prepared with Dragon dictation along with smaller phrase technology. Any  transcriptional errors that result from this process are unintentional.

## 2021-04-03 NOTE — Assessment & Plan Note (Signed)
Will continue mild tightness.  Symptoms more tightness likely thoracolumbar juncture as well.  Discussed with patient about icing regimen and home exercises, with activities to do with complete void.  Increase activity slowly.  Patient presents with improvement we did discuss the possibility of ergonomics.  Follow-up with me again 6 weeks

## 2021-04-09 ENCOUNTER — Encounter: Payer: Self-pay | Admitting: Physician Assistant

## 2021-04-09 ENCOUNTER — Telehealth (INDEPENDENT_AMBULATORY_CARE_PROVIDER_SITE_OTHER): Payer: 59 | Admitting: Physician Assistant

## 2021-04-09 ENCOUNTER — Other Ambulatory Visit: Payer: Self-pay | Admitting: Physician Assistant

## 2021-04-09 DIAGNOSIS — Z720 Tobacco use: Secondary | ICD-10-CM

## 2021-04-09 DIAGNOSIS — Z789 Other specified health status: Secondary | ICD-10-CM

## 2021-04-09 MED ORDER — NORGESTIMATE-ETH ESTRADIOL 0.25-35 MG-MCG PO TABS: 1.0000 | ORAL_TABLET | Freq: Every day | ORAL | 3 refills | Status: DC
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
## 2021-04-11 ENCOUNTER — Other Ambulatory Visit: Payer: Self-pay

## 2021-04-11 DIAGNOSIS — Z789 Other specified health status: Secondary | ICD-10-CM

## 2021-04-11 MED ORDER — NORGESTIMATE-ETH ESTRADIOL 0.25-35 MG-MCG PO TABS: 1.0000 | ORAL_TABLET | Freq: Every day | ORAL | 0 refills | Status: DC

## 2021-04-30 ENCOUNTER — Other Ambulatory Visit: Payer: Self-pay | Admitting: Family Medicine

## 2021-05-15 ENCOUNTER — Ambulatory Visit: Payer: 59 | Admitting: Family Medicine

## 2021-05-27 ENCOUNTER — Telehealth: Payer: 59 | Admitting: Family

## 2021-05-27 DIAGNOSIS — R399 Unspecified symptoms and signs involving the genitourinary system: Secondary | ICD-10-CM

## 2021-05-27 DIAGNOSIS — R109 Unspecified abdominal pain: Secondary | ICD-10-CM

## 2021-05-27 NOTE — Progress Notes (Signed)
Based on what you shared with me, I feel your condition warrants further evaluation and I recommend that you be seen in a face to face visit.   Given your symptoms and back pain, you need to be seen in person for a urine test.  NOTE: There will be NO CHARGE for this eVisit   If you are having a true medical emergency please call 911.      For an urgent face to face visit, [facility] has six urgent care centers for your convenience:     [facility] Health [facility] Center at [facility] Directions [phone] [address] [place], [place] [address]    [facility] Health Urgent Care Center [facility]) Get Driving Directions [phone] [address] [place], [place] [address]  [facility] Health Urgent Care Center [facility]) Get Driving Directions [phone] [address] [place],  [place]  [address]  [facility] Health [facility]gent Care at [facility] Get Driving Directions [phone] [address] [place], [place] [address]   [facility] Health Urgent Care at [facility] Get Driving Directions  [phone] [address] [place], [place] [address]   [facility] Health Urgent Care at [facility] Directions [phone] [address], [address] [place], [place] [address]  Your MyChart E-visit questionnaire answers were reviewed by a board certified advanced clinical practitioner to complete your personal care plan based on your specific symptoms.  Thank you for using e-Visits.

## 2021-05-27 NOTE — Progress Notes (Signed)
[name] [address] [place] [address] Phone: [phone] Subjective:   I Diane [name] am serving as a Neurosurgeon for Dr. Antoine Primas.  This visit occurred during the SARS-CoV-2 public health emergency.  Safety protocols were in place, including screening questions prior to the visit, additional usage of staff PPE, and extensive cleaning of exam room while observing appropriate contact time as indicated for disinfecting solutions.   I'm seeing this patient by the request  of:  Jarold Motto, [place]  CC: Upper back and low back follow-up  [id number]  Yukari Flax is a 30 y.o. female coming in with complaint of back and neck pain. OMT 04/03/2021. Patient states she is doing well.  Better than previously.  Has been more active.  Has restarted roller derby   Medications patient has been prescribed: Zanaflex  Taking:intermittently          Reviewed prior external information including notes and imaging from previsou exam, outside providers and external EMR if available.   As well as notes that were available from care everywhere and other healthcare systems.  Past medical history, social, surgical and family history all reviewed in electronic medical record.  No pertanent information unless stated regarding to the chief complaint.   Past Medical History:  Diagnosis Date   Asthma    Vitamin D deficiency     No Known Allergies   Review of Systems:  No headache, visual changes, nausea, vomiting, diarrhea, constipation, dizziness, abdominal pain, skin rash, fevers, chills, night sweats, weight loss, swollen lymph nodes, body aches, joint swelling, chest pain, shortness of breath, mood changes. POSITIVE muscle aches  Objective  Blood pressure 100/70, pulse 95, height 5\' 2"  (1.575 m), weight 166 lb (75.3 kg), SpO2 96 %.   General: No apparent distress alert and oriented x3 mood and affect normal, dressed appropriately.  HEENT: Pupils  equal, extraocular movements intact  Respiratory: Patient's speak in full sentences and does not appear short of breath  Cardiovascular: No lower extremity edema, non tender, no erythema   Patient's low back does have some mild tightness still noted.  Patient does have mild weakness to hip abductors right greater than left.  Mild tightness with FABER test.  Still some tightness in the parascapular region right greater than left.  Neck exam does have some mild loss of lordosis.  Osteopathic findings  C2 flexed rotated and side bent right C6 flexed rotated and side bent left T3 extended rotated and side bent right inhaled rib T9 extended rotated and side bent left L2 flexed rotated and side bent right Sacrum right on right       Assessment and Plan:  Hip flexor tightness, right Continues to have mild tightness that I think is more secondary to the hip abductor weakness.  Given some therapy to help on hip abductor strengthening.  He does have the muscle relaxer for any breakthrough.  Discussed with patient icing regimen and home exercises.  Patient started well was reviewed we will monitor.  Follow-up with me again 2 months   Nonallopathic problems  Decision today to treat with OMT was based on Physical Exam  After verbal consent patient was treated with HVLA, ME, FPR techniques in cervical, rib, thoracic, lumbar, and sacral  areas  Patient tolerated the procedure well with improvement in symptoms  Patient given exercises, stretches and lifestyle modifications  See medications in patient instructions if given  Patient will follow up in 4-8 weeks  The above documentation has been reviewed and is accurate and complete Lyndal Pulley, DO        Note: This dictation was prepared with Dragon dictation along with smaller phrase technology. Any transcriptional errors that result from this process are unintentional.

## 2021-05-28 ENCOUNTER — Encounter: Payer: Self-pay | Admitting: Family Medicine

## 2021-05-28 ENCOUNTER — Other Ambulatory Visit: Payer: Self-pay

## 2021-05-28 ENCOUNTER — Ambulatory Visit (INDEPENDENT_AMBULATORY_CARE_PROVIDER_SITE_OTHER): Payer: 59 | Admitting: Family Medicine

## 2021-05-28 VITALS — BP 100/70 | HR 95 | Ht 62.0 in | Wt 166.0 lb

## 2021-05-28 DIAGNOSIS — M24551 Contracture, right hip: Secondary | ICD-10-CM | POA: Diagnosis not present

## 2021-05-28 DIAGNOSIS — M9908 Segmental and somatic dysfunction of rib cage: Secondary | ICD-10-CM | POA: Diagnosis not present

## 2021-05-28 DIAGNOSIS — M9902 Segmental and somatic dysfunction of thoracic region: Secondary | ICD-10-CM | POA: Diagnosis not present

## 2021-05-28 DIAGNOSIS — M9904 Segmental and somatic dysfunction of sacral region: Secondary | ICD-10-CM | POA: Diagnosis not present

## 2021-05-28 DIAGNOSIS — M9903 Segmental and somatic dysfunction of lumbar region: Secondary | ICD-10-CM | POA: Diagnosis not present

## 2021-05-28 DIAGNOSIS — M9901 Segmental and somatic dysfunction of cervical region: Secondary | ICD-10-CM | POA: Diagnosis not present

## 2021-05-28 NOTE — Assessment & Plan Note (Signed)
Continues to have mild tightness that I think is more secondary to the hip abductor weakness.  Given some therapy to help on hip abductor strengthening.  He does have the muscle relaxer for any breakthrough.  Discussed with patient icing regimen and home exercises.  Patient started well was reviewed we will monitor.  Follow-up with me again 2 months

## 2021-05-28 NOTE — Patient Instructions (Addendum)
Good to see you Take it easy on the other girls See me again in 8 weeks

## 2021-06-03 ENCOUNTER — Other Ambulatory Visit: Payer: Self-pay | Admitting: Family Medicine

## 2021-06-07 ENCOUNTER — Other Ambulatory Visit: Payer: Self-pay | Admitting: Emergency Medicine

## 2021-06-09 ENCOUNTER — Other Ambulatory Visit: Payer: Self-pay | Admitting: Emergency Medicine

## 2021-06-26 ENCOUNTER — Other Ambulatory Visit: Payer: Self-pay | Admitting: Family Medicine

## 2021-07-22 ENCOUNTER — Other Ambulatory Visit: Payer: Self-pay | Admitting: Physician Assistant

## 2021-07-22 DIAGNOSIS — Z789 Other specified health status: Secondary | ICD-10-CM

## 2021-07-23 ENCOUNTER — Other Ambulatory Visit: Payer: Self-pay | Admitting: Family Medicine

## 2021-08-05 NOTE — Progress Notes (Deleted)
  [name] [address] [place] [address] Phone: [phone] Subjective:    I'm seeing this patient by the request  of:  Jarold Motto, [place]  CC:   [id number]  Diane [name] is a 30 y.o. female coming in with complaint of back and neck pain. OMT on 05/28/2021. Patient states   Medications patient has been prescribed: Zanaflex  Taking:         Reviewed prior external information including notes and imaging from previsou exam, outside providers and external EMR if available.   As well as notes that were available from care everywhere and other healthcare systems.  Past medical history, social, surgical and family history all reviewed in electronic medical record.  No pertanent information unless stated regarding to the chief complaint.   Past Medical History:  Diagnosis Date   Asthma    Vitamin D deficiency     No Known Allergies   Review of Systems:  No headache, visual changes, nausea, vomiting, diarrhea, constipation, dizziness, abdominal pain, skin rash, fevers, chills, night sweats, weight loss, swollen lymph nodes, body aches, joint swelling, chest pain, shortness of breath, mood changes. POSITIVE muscle aches  Objective  There were no vitals taken for this visit.   General: No apparent distress alert and oriented x3 mood and affect normal, dressed appropriately.  HEENT: Pupils equal, extraocular movements intact  Respiratory: Patient's speak in full sentences and does not appear short of breath  Cardiovascular: No lower extremity edema, non tender, no erythema  Neuro: Cranial nerves II through XII are intact, neurovascularly intact in all extremities with 2+ DTRs and 2+ pulses.  Gait normal with good balance and coordination.  MSK:  Non tender with full range of motion and good stability and symmetric strength and tone of shoulders, elbows, wrist, hip, knee and ankles bilaterally.  Back - Normal skin, Spine with  normal alignment and no deformity.  No tenderness to vertebral process palpation.  Paraspinous muscles are not tender and without spasm.   Range of motion is full at neck and lumbar sacral regions  Osteopathic findings  C2 flexed rotated and side bent right C6 flexed rotated and side bent left T3 extended rotated and side bent right inhaled rib T9 extended rotated and side bent left L2 flexed rotated and side bent right Sacrum right on right       Assessment and Plan:    Nonallopathic problems  Decision today to treat with OMT was based on Physical Exam  After verbal consent patient was treated with HVLA, ME, FPR techniques in cervical, rib, thoracic, lumbar, and sacral  areas  Patient tolerated the procedure well with improvement in symptoms  Patient given exercises, stretches and lifestyle modifications  See medications in patient instructions if given  Patient will follow up in 4-8 weeks      The above documentation has been reviewed and is accurate and complete Judi Saa, DO       Note: This dictation was prepared with Dragon dictation along with smaller phrase technology. Any transcriptional errors that result from this process are unintentional.

## 2021-08-06 ENCOUNTER — Ambulatory Visit: Payer: 59 | Admitting: Family Medicine

## 2021-08-08 ENCOUNTER — Other Ambulatory Visit: Payer: Self-pay | Admitting: Physician Assistant

## 2021-08-12 NOTE — Telephone Encounter (Signed)
Please call and see if patient is on zoloft? During out last encounter she was not on this.

## 2021-08-12 NOTE — Telephone Encounter (Signed)
Please advise for medication refill. 

## 2021-08-13 MED ORDER — SERTRALINE HCL 50 MG PO TABS: 50.0000 mg | ORAL_TABLET | Freq: Every day | ORAL | 2 refills | Status: DC

## 2021-08-13 NOTE — Telephone Encounter (Signed)
Left detailed message on personal voicemail received request from pharmacy for Zoloft according to our records last you were not taking. Please call the office and let us know whether you are taking this or not.

## 2021-08-13 NOTE — Telephone Encounter (Signed)
Pt called back and said she is taking Zoloft but it was changed to 50 mg daily instead of 75 mg. Her psychiatrist at [facility] decreased her dose. Told her okay I will send new Rx for Zoloft 50 mg daly to pharmacy for you. Pt verbalized understanding.

## 2021-08-20 ENCOUNTER — Other Ambulatory Visit: Payer: Self-pay | Admitting: Family Medicine

## 2021-08-21 NOTE — Progress Notes (Signed)
[name] [address] [place] [address] Phone: [phone] Subjective:   INadine Counts, am serving as a scribe for Dr. Antoine Primas. This visit occurred during the SARS-CoV-2 public health emergency.  Safety protocols were in place, including screening questions prior to the visit, additional usage of staff PPE, and extensive cleaning of exam room while observing appropriate contact time as indicated for disinfecting solutions.   I'm seeing this patient by the request  of:  Jarold Motto, [place]  CC: Neck, low back and hip pain  [id number]  Diane [name] is a 30 y.o. female coming in with complaint of back and neck pain. OMT 05/28/2021. Patient states back and neck pain remain the same. She began roller derby again after 2 years and has been having a sharp pain in her right gluteal primarily during the motion of adduction and abduction.  Patient states that she is able to work through it but then can have significant discomfort.  Has not been taking medications on a regular basis now.  Medications patient has been prescribed: Zanaflex           Past Medical History:  Diagnosis Date   Asthma    Vitamin D deficiency     No Known Allergies   Review of Systems:  No headache, visual changes, nausea, vomiting, diarrhea, constipation, dizziness, abdominal pain, skin rash, fevers, chills, night sweats, weight loss, swollen lymph nodes, body aches, joint swelling, chest pain, shortness of breath, mood changes. POSITIVE muscle aches  Objective  Blood pressure 118/68, pulse (!) 108, height 5\' 2"  (1.575 m), weight 168 lb (76.2 kg), SpO2 98 %.   General: No apparent distress alert and oriented x3 mood and affect normal, dressed appropriately.  HEENT: Pupils equal, extraocular movements intact  Respiratory: Patient's speak in full sentences and does not appear short of breath  Cardiovascular: No lower extremity edema, non tender, no  erythema  Patient does have tightness still noted of the hip flexor on the right side noted.  Patient does have some tenderness to palpation in the gluteal area.  Weakness noted of the extremity relative to the quadriceps as well as the weakness of the hip abductor right greater than left.  Osteopathic findings  C4 flexed rotated and side bent left T3 extended rotated and side bent right inhaled rib T7 extended rotated and side bent left L2 flexed rotated and side bent right Sacrum right on right       Assessment and Plan:  Hip flexor tightness, right Patient does have a tightness in the right hip flexor but also has more in the gluteal area and discomfort.  I do feel the patient does have some weakness of the hamstring on the right side made some suggestions and changes to her scheduled to see if this will be beneficial as well.  Increase activity as tolerated and follow-up with me again in 6 to 8 weeks   Nonallopathic problems  Decision today to treat with OMT was based on Physical Exam  After verbal consent patient was treated with HVLA, ME, FPR techniques in cervical, rib, thoracic, lumbar, and sacral  areas  Patient tolerated the procedure well with improvement in symptoms  Patient given exercises, stretches and lifestyle modifications  See medications in patient instructions if given  Patient will follow up in 4-8 weeks      The above documentation has been reviewed and is accurate and complete , DO  Note: This dictation was prepared with Dragon dictation along with smaller phrase technology. Any transcriptional errors that result from this process are unintentional.

## 2021-08-22 ENCOUNTER — Ambulatory Visit (INDEPENDENT_AMBULATORY_CARE_PROVIDER_SITE_OTHER): Payer: 59 | Admitting: Family Medicine

## 2021-08-22 ENCOUNTER — Other Ambulatory Visit: Payer: Self-pay

## 2021-08-22 VITALS — BP 118/68 | HR 108 | Ht 62.0 in | Wt 168.0 lb

## 2021-08-22 DIAGNOSIS — M9902 Segmental and somatic dysfunction of thoracic region: Secondary | ICD-10-CM | POA: Diagnosis not present

## 2021-08-22 DIAGNOSIS — M24551 Contracture, right hip: Secondary | ICD-10-CM

## 2021-08-22 DIAGNOSIS — M9908 Segmental and somatic dysfunction of rib cage: Secondary | ICD-10-CM

## 2021-08-22 DIAGNOSIS — M9904 Segmental and somatic dysfunction of sacral region: Secondary | ICD-10-CM | POA: Diagnosis not present

## 2021-08-22 DIAGNOSIS — M9901 Segmental and somatic dysfunction of cervical region: Secondary | ICD-10-CM

## 2021-08-22 DIAGNOSIS — M9903 Segmental and somatic dysfunction of lumbar region: Secondary | ICD-10-CM

## 2021-08-22 NOTE — Patient Instructions (Signed)
Good to see you! Lets keep you rolling Do prescribed exercises at least 3x a week as cool downs See you again in 6 weeks

## 2021-08-22 NOTE — Assessment & Plan Note (Signed)
Patient does have a tightness in the right hip flexor but also has more in the gluteal area and discomfort.  I do feel the patient does have some weakness of the hamstring on the right side made some suggestions and changes to her scheduled to see if this will be beneficial as well.  Increase activity as tolerated and follow-up with me again in 6 to 8 weeks

## 2021-09-07 ENCOUNTER — Other Ambulatory Visit: Payer: Self-pay | Admitting: Physician Assistant

## 2021-09-11 ENCOUNTER — Other Ambulatory Visit: Payer: Self-pay | Admitting: Family Medicine

## 2021-09-19 NOTE — Progress Notes (Deleted)
[name] [address] [place] [address] Phone: [phone] Subjective:    I'm seeing this patient by the request  of:  Jarold Motto, [place]  CC:   [id number]  Amyri Frenz is a 30 y.o. female coming in with complaint of shoulder pain. Patient states   Onset-  Location Duration-  Character- Aggravating factors- Reliving factors-  Therapies tried-  Severity-     Past Medical History:  Diagnosis Date   Asthma    Vitamin D deficiency    Past Surgical History:  Procedure Laterality Date   TOOTH EXTRACTION     Social History   Socioeconomic History   Marital status: Married    Spouse name: Not on file   Number of children: Not on file   Years of education: Not on file   Highest education level: Not on file  Occupational History   Not on file  Tobacco Use   Smoking status: Every Day    Packs/day: 1.00    Years: 9.00    Pack years: 9.00    Types: Cigarettes    Last attempt to quit: 03/16/2018    Years since quitting: 3.5   Smokeless tobacco: Never   Tobacco comments:    1 pack smoked daily. [facility] 08/31/20  Vaping Use   Vaping Use: Never used  Substance and Sexual Activity   Alcohol use: Yes    Comment: occ   Drug use: Never   Sexual activity: Yes  Other Topics Concern   Not on file  Social History Narrative   Married, no children   Works with loans for [employer]   Social Determinants of Corporate investment banker Strain: Not on file  Food Insecurity: Not on file  Transportation Needs: Not on file  Physical Activity: Not on file  Stress: Not on file  Social Connections: Not on file   No Known Allergies Family History  Problem Relation Age of Onset   Throat cancer Father     Current Outpatient Medications (Endocrine & Metabolic):    norgestimate-ethinyl estradiol (ORTHO-CYCLEN) 0.25-35 MG-MCG tablet, TAKE 1 TABLET BY MOUTH EVERY DAY   Current Outpatient Medications (Respiratory):    loratadine  (CLARITIN) 10 MG tablet, TAKE 1 TABLET BY MOUTH EVERY DAY    Current Outpatient Medications (Other):    pantoprazole (PROTONIX) 40 MG tablet, TAKE 1 TABLET BY MOUTH EVERY DAY   sertraline (ZOLOFT) 50 MG tablet, TAKE 1 TABLET BY MOUTH EVERY DAY   tiZANidine (ZANAFLEX) 2 MG tablet, TAKE 1 TABLET BY MOUTH EVERYDAY AT BEDTIME   Vitamin D, Ergocalciferol, (DRISDOL) 1.25 MG (50000 UNIT) CAPS capsule, TAKE 1 CAPSULE BY MOUTH EVERY 7 DAYS.   Reviewed prior external information including notes and imaging from  primary care provider As well as notes that were available from care everywhere and other healthcare systems.  Past medical history, social, surgical and family history all reviewed in electronic medical record.  No pertanent information unless stated regarding to the chief complaint.   Review of Systems:  No headache, visual changes, nausea, vomiting, diarrhea, constipation, dizziness, abdominal pain, skin rash, fevers, chills, night sweats, weight loss, swollen lymph nodes, body aches, joint swelling, chest pain, shortness of breath, mood changes. POSITIVE muscle aches  Objective  There were no vitals taken for this visit.   General: No apparent distress alert and oriented x3 mood and affect normal, dressed appropriately.  HEENT: Pupils equal, extraocular movements intact  Respiratory: Patient's speak in full sentences and  does not appear short of breath  Cardiovascular: No lower extremity edema, non tender, no erythema  Gait normal with good balance and coordination.  MSK:  Non tender with full range of motion and good stability and symmetric strength and tone of shoulders, elbows, wrist, hip, knee and ankles bilaterally.     Impression and Recommendations:     The above documentation has been reviewed and is accurate and complete [name]

## 2021-09-19 NOTE — Progress Notes (Signed)
Diane [name] [name] [address] [place] [address] Phone: [phone]   Assessment and Plan:    1. Pain in joint of right shoulder 2. Acromioclavicular sprain, right, initial encounter -Acute, limited, initial sports medicine visit - Likely acute sprain of right AC joint based on HPI, physical exam, ultrasound - Start meloxicam 15 mg daily for 2 weeks - Limit physical activity x1 week and then may slowly reintroduce - X-rays obtained in clinic.  My interpretation: No acute fracture of clavicle.  Saunders and AC joints appear well approximated.  No significant cortical irregularity.  Sports Medicine: Musculoskeletal Ultrasound. Exam: Limited US of right AC Diagnosis: Pain at right [facility] joint  US Findings: Acromion and clavicle appear well approximated without significant edema.  Patient experiences TTP over [facility] joint and distal clavicle  US Impression:  Unremarkable ultrasound - TTP could be consistent with recovering grade 1 AC joint sprain   Pertinent previous records reviewed include none   Follow Up: As needed in 2 to 3 weeks if no improvement or worsening of symptoms.  Could consider subacromial versus AC joint injection, HEP, PT   Subjective:    I, Diane [name], am serving as a scribe for Dr. Aleen [name]. This visit occurred during the SARS-CoV-2 public health emergency.  Safety protocols were in place, including screening questions prior to the visit, additional usage of staff PPE, and extensive cleaning of exam room while observing appropriate contact time as indicated for disinfecting solutions.   Chief Complaint: Shoulder pain   HPI:   09/20/21 Patient is a 30 year old female presenting with shoulder pain for about 2.5 weeks. Woke up in pain. No true injury. Patient locates pain to superior and anterior aspect. Pain is with movement and is achy in character. Denies any radiating symptoms. Is able to workout despite pain. Does  rollerderby and is now getting into heavy contact practices.      Relevant Historical Information: None pertinent  Additional pertinent review of systems negative.   Current Outpatient Medications:    loratadine (CLARITIN) 10 MG tablet, TAKE 1 TABLET BY MOUTH EVERY DAY, Disp: 90 tablet, Rfl: 3   norgestimate-ethinyl estradiol (ORTHO-CYCLEN) 0.25-35 MG-MCG tablet, TAKE 1 TABLET BY MOUTH EVERY DAY, Disp: 84 tablet, Rfl: 0   pantoprazole (PROTONIX) 40 MG tablet, TAKE 1 TABLET BY MOUTH EVERY DAY, Disp: 30 tablet, Rfl: 3   sertraline (ZOLOFT) 50 MG tablet, TAKE 1 TABLET BY MOUTH EVERY DAY, Disp: 90 tablet, Rfl: 0   tiZANidine (ZANAFLEX) 2 MG tablet, TAKE 1 TABLET BY MOUTH EVERYDAY AT BEDTIME, Disp: 30 tablet, Rfl: 0   Vitamin D, Ergocalciferol, (DRISDOL) 1.25 MG (50000 UNIT) CAPS capsule, TAKE 1 CAPSULE BY MOUTH EVERY 7 DAYS., Disp: 4 capsule, Rfl: 2   Objective:     Vitals:   09/20/21 1117  BP: 112/80  Pulse: 92  SpO2: 99%  Weight: 168 lb (76.2 kg)  Height: 5\' 2"  (1.575 m)      Body mass index is 30.73 kg/m.    Physical Exam:    Gen: Appears well, nad, nontoxic and pleasant Neuro:sensation intact, strength is 5/5 with df/pf/inv/ev, muscle tone wnl Skin: no suspicious lesion or defmority Psych: A&O, appropriate mood and affect  Right shoulder: no deformity, swelling or muscle wasting No scapular winging FF 180, abd 180, int 0, ext 90 TTP AC, clavicle NTTP over the , coracoid, biceps groove, humerus, deltoid, trapezius, cervical spine Neg neer, hawkings, empty can, subscap liftoff, speeds  Positive crossarm, O'Brien Neg ant drawer, sulcus sign, apprehension Negative Spurling's test bilat FROM of neck    Electronically signed by:  Diane [name] [name] 11:50 AM 09/20/21

## 2021-09-20 ENCOUNTER — Ambulatory Visit: Payer: Self-pay

## 2021-09-20 ENCOUNTER — Ambulatory Visit (INDEPENDENT_AMBULATORY_CARE_PROVIDER_SITE_OTHER): Payer: 59

## 2021-09-20 ENCOUNTER — Other Ambulatory Visit: Payer: Self-pay

## 2021-09-20 ENCOUNTER — Ambulatory Visit (INDEPENDENT_AMBULATORY_CARE_PROVIDER_SITE_OTHER): Payer: 59 | Admitting: Sports Medicine

## 2021-09-20 ENCOUNTER — Ambulatory Visit: Payer: 59 | Admitting: Family Medicine

## 2021-09-20 VITALS — BP 112/80 | HR 92 | Ht 62.0 in | Wt 168.0 lb

## 2021-09-20 DIAGNOSIS — S4351XA Sprain of right acromioclavicular joint, initial encounter: Secondary | ICD-10-CM

## 2021-09-20 DIAGNOSIS — M25511 Pain in right shoulder: Secondary | ICD-10-CM | POA: Diagnosis not present

## 2021-09-20 NOTE — Patient Instructions (Addendum)
Xray today Meloxicam daily for 2 weeks Limit physical activity for one week and then gradual introduction See me in 2-3 weeks if not better

## 2021-10-01 ENCOUNTER — Ambulatory Visit: Payer: 59 | Admitting: Family Medicine

## 2021-10-02 ENCOUNTER — Other Ambulatory Visit: Payer: Self-pay | Admitting: Physician Assistant

## 2021-10-02 DIAGNOSIS — Z789 Other specified health status: Secondary | ICD-10-CM

## 2021-10-03 ENCOUNTER — Other Ambulatory Visit: Payer: Self-pay | Admitting: Emergency Medicine

## 2021-10-07 ENCOUNTER — Other Ambulatory Visit: Payer: Self-pay

## 2021-10-07 ENCOUNTER — Ambulatory Visit (INDEPENDENT_AMBULATORY_CARE_PROVIDER_SITE_OTHER): Payer: 59 | Admitting: Sports Medicine

## 2021-10-07 VITALS — BP 122/80 | HR 99 | Ht 62.0 in | Wt 170.0 lb

## 2021-10-07 DIAGNOSIS — M9902 Segmental and somatic dysfunction of thoracic region: Secondary | ICD-10-CM

## 2021-10-07 DIAGNOSIS — M542 Cervicalgia: Secondary | ICD-10-CM | POA: Diagnosis not present

## 2021-10-07 DIAGNOSIS — M9905 Segmental and somatic dysfunction of pelvic region: Secondary | ICD-10-CM

## 2021-10-07 DIAGNOSIS — M9903 Segmental and somatic dysfunction of lumbar region: Secondary | ICD-10-CM

## 2021-10-07 DIAGNOSIS — M9908 Segmental and somatic dysfunction of rib cage: Secondary | ICD-10-CM

## 2021-10-07 DIAGNOSIS — M9901 Segmental and somatic dysfunction of cervical region: Secondary | ICD-10-CM | POA: Diagnosis not present

## 2021-10-07 NOTE — Progress Notes (Signed)
Diane [name] [name] [address] [place] [address] Phone: [phone]   Assessment and Plan:     1. Neck pain 2. Somatic dysfunction of cervical region 3. Somatic dysfunction of thoracic region 4. Somatic dysfunction of lumbar region 5. Somatic dysfunction of pelvic region 6. Somatic dysfunction of rib region -Chronic with exacerbation, subsequent visit - Recurrence of upper back and neck pain without red flag symptoms - Patient has received moderate to significant relief in the past with OMT.  Elects for repeat OMT today.  Tolerated well per note below  Decision today to treat with OMT was based on Physical Exam   After verbal consent patient was treated with HVLA (high velocity low amplitude), ME (muscle energy), FPR (flex positional release), ST (soft tissue), PC/PD (Pelvic Compression/ Pelvic Decompression) techniques in cervical, rib, thoracic, lumbar, and pelvic areas. Patient tolerated the procedure well with improvement in symptoms.  Patient educated on potential side effects of soreness and recommended to rest, hydrate, and use Tylenol as needed for pain control.    Pertinent previous records reviewed include none   Follow Up: 4 to 6 weeks for repeat OMT, or sooner if recurrence of AC joint pain   Subjective:   I, Debbe Odea, am serving as a scribe for Dr. Richardean Sale  Chief Complaint: shoulder pain   HPI:   09/20/21 Patient is a 30 year old female presenting with shoulder pain for about 2.5 weeks. Woke up in pain. No true injury. Patient locates pain to superior and anterior aspect. Pain is with movement and is achy in character. Denies any radiating symptoms. Is able to workout despite pain. Does rollerderby and is now getting into heavy contact practices.   10/07/21 Patient states that her neck shoulder and hips are the biggest issues that she has OMT for. States that she feels like there is a knot on the  left side of her neck. Patient states that her right shoulder has been doing fine.   Relevant Historical Information: None pertinent  Additional pertinent review of systems negative.   Current Outpatient Medications:    loratadine (CLARITIN) 10 MG tablet, TAKE 1 TABLET BY MOUTH EVERY DAY, Disp: 90 tablet, Rfl: 3   norgestimate-ethinyl estradiol (ORTHO-CYCLEN) 0.25-35 MG-MCG tablet, TAKE 1 TABLET BY MOUTH EVERY DAY, Disp: 28 tablet, Rfl: 2   pantoprazole (PROTONIX) 40 MG tablet, TAKE 1 TABLET BY MOUTH EVERY DAY, Disp: 30 tablet, Rfl: 3   sertraline (ZOLOFT) 50 MG tablet, TAKE 1 TABLET BY MOUTH EVERY DAY, Disp: 90 tablet, Rfl: 0   tiZANidine (ZANAFLEX) 2 MG tablet, TAKE 1 TABLET BY MOUTH EVERYDAY AT BEDTIME, Disp: 30 tablet, Rfl: 0   Vitamin D, Ergocalciferol, (DRISDOL) 1.25 MG (50000 UNIT) CAPS capsule, TAKE 1 CAPSULE BY MOUTH EVERY 7 DAYS., Disp: 4 capsule, Rfl: 2   Objective:     Vitals:   10/07/21 1601  BP: 122/80  Pulse: 99  SpO2: 98%  Weight: 170 lb (77.1 kg)  Height: 5\' 2"  (1.575 m)      Body mass index is 31.09 kg/m.    Physical Exam:    General: Well-appearing, cooperative, sitting comfortably in no acute distress.   OMT Physical Exam:  ASIS Compression Test: Positive Right Cervical: TTP paraspinal, C3 RRSL Rib: ribs 4-8 inhalation dysfunction on the right thoracic: Mild TTP paraspinal, T4-8 RR SL Lumbar: Mild TTP paraspinal, L1-3 RRSL Pelvis: Right anterior innominate    Electronically signed by:  D.Diane [name]   Sports Medicine 4:30 PM 10/07/21

## 2021-10-07 NOTE — Patient Instructions (Addendum)
Patient states   Follow up in 4-6 weeks for follow up OMT

## 2021-10-09 ENCOUNTER — Other Ambulatory Visit: Payer: Self-pay | Admitting: Family Medicine

## 2021-10-16 ENCOUNTER — Other Ambulatory Visit: Payer: Self-pay | Admitting: Physician Assistant

## 2021-11-04 NOTE — Progress Notes (Signed)
Diane [name] [name] [address] [place] [address] Phone: [phone]   Assessment and Plan:     1. Neck pain 2. Left hip pain 3. Somatic dysfunction of cervical region 4. Somatic dysfunction of thoracic region 5. Somatic dysfunction of lumbar region 6. Somatic dysfunction of pelvic region 7. Somatic dysfunction of rib region -Chronic with exacerbation, subsequent visit - Recurrence of multiple musculoskeletal complaints with most prominent today being left hip, bilateral upper trapezius and neck - Patient has received significant relief with OMT in the past.  Elects for repeat OMT today.  Tolerated well per note below. - Decision today to treat with OMT was based on Physical Exam   After verbal consent patient was treated with HVLA (high velocity low amplitude), ME (muscle energy), FPR (flex positional release), ST (soft tissue), PC/PD (Pelvic Compression/ Pelvic Decompression) techniques in cervical, rib, thoracic, lumbar, and pelvic areas. Patient tolerated the procedure well with improvement in symptoms.  Patient educated on potential side effects of soreness and recommended to rest, hydrate, and use Tylenol as needed for pain control.   Pertinent previous records reviewed include none   Follow Up: 4 to 6 weeks for repeat OMT   Subjective:    Chief Complaint: Shoulder pain   HPI:   09/20/21 Patient is a 30 year old female presenting with shoulder pain for about 2.5 weeks. Woke up in pain. No true injury. Patient locates pain to superior and anterior aspect. Pain is with movement and is achy in character. Denies any radiating symptoms. Is able to workout despite pain. Does rollerderby and is now getting into heavy contact practices.    10/07/21 Patient states that her neck shoulder and hips are the biggest issues that she has OMT for. States that she feels like there is a knot on the left side of her neck. Patient states that her right  shoulder has been doing fine.   11/05/21 Patient states that neck and hip and not great had a roller derby scrimmage yesterday , had weird pinch in L shoulder yesterday on top of shoulder, R shoulder is okay tolerable at the moment ,L lateral hip is painful started today   Relevant Historical Information: none pertinent   Additional pertinent review of systems negative.  Current Outpatient Medications  Medication Sig Dispense Refill   loratadine (CLARITIN) 10 MG tablet TAKE 1 TABLET BY MOUTH EVERY DAY 90 tablet 3   norgestimate-ethinyl estradiol (ORTHO-CYCLEN) 0.25-35 MG-MCG tablet TAKE 1 TABLET BY MOUTH EVERY DAY 28 tablet 2   pantoprazole (PROTONIX) 40 MG tablet TAKE 1 TABLET BY MOUTH EVERY DAY 30 tablet 3   sertraline (ZOLOFT) 50 MG tablet TAKE 1 AND 1/2 TABLETS BY MOUTH DAILY 45 tablet 1   tiZANidine (ZANAFLEX) 2 MG tablet TAKE 1 TABLET BY MOUTH EVERYDAY AT BEDTIME 30 tablet 0   Vitamin D, Ergocalciferol, (DRISDOL) 1.25 MG (50000 UNIT) CAPS capsule TAKE 1 CAPSULE BY MOUTH EVERY 7 DAYS. 4 capsule 2   No current facility-administered medications for this visit.      Objective:     Vitals:   11/05/21 1555  BP: 130/86  Pulse: 88  SpO2: 98%  Weight: 170 lb (77.1 kg)  Height: 5\' 2"  (1.575 m)      Body mass index is 31.09 kg/m.    Physical Exam:     General: Well-appearing, cooperative, sitting comfortably in no acute distress.   OMT Physical Exam:  ASIS Compression Test: Positive Right Cervical: TTP paraspinal,  C3 RR SL Rib: Bilateral elevated first rib with TTP Thoracic: TTP paraspinal, T3-8 RRSL Lumbar: TTP paraspinal, L1-3 RLSR Pelvis: Right anterior innominate  Electronically signed by:  Diane [name] [name] 4:17 PM 11/05/21

## 2021-11-05 ENCOUNTER — Other Ambulatory Visit: Payer: Self-pay

## 2021-11-05 ENCOUNTER — Ambulatory Visit (INDEPENDENT_AMBULATORY_CARE_PROVIDER_SITE_OTHER): Payer: 59 | Admitting: Sports Medicine

## 2021-11-05 VITALS — BP 130/86 | HR 88 | Ht 62.0 in | Wt 170.0 lb

## 2021-11-05 DIAGNOSIS — M9902 Segmental and somatic dysfunction of thoracic region: Secondary | ICD-10-CM | POA: Diagnosis not present

## 2021-11-05 DIAGNOSIS — M9903 Segmental and somatic dysfunction of lumbar region: Secondary | ICD-10-CM

## 2021-11-05 DIAGNOSIS — M9908 Segmental and somatic dysfunction of rib cage: Secondary | ICD-10-CM

## 2021-11-05 DIAGNOSIS — M9901 Segmental and somatic dysfunction of cervical region: Secondary | ICD-10-CM

## 2021-11-05 DIAGNOSIS — M542 Cervicalgia: Secondary | ICD-10-CM | POA: Diagnosis not present

## 2021-11-05 DIAGNOSIS — M9905 Segmental and somatic dysfunction of pelvic region: Secondary | ICD-10-CM

## 2021-11-05 DIAGNOSIS — M25552 Pain in left hip: Secondary | ICD-10-CM | POA: Diagnosis not present

## 2021-11-05 NOTE — Patient Instructions (Addendum)
Good to see you  4-6 week follow up for repeat OMT

## 2021-11-08 ENCOUNTER — Other Ambulatory Visit: Payer: Self-pay | Admitting: Physician Assistant

## 2021-11-08 ENCOUNTER — Other Ambulatory Visit: Payer: Self-pay | Admitting: Family Medicine

## 2021-12-03 NOTE — Progress Notes (Signed)
Diane [name] [address] [place] [address] Phone: [phone]   Assessment and Plan:     1.  Bilateral thoracic back pain 2. Somatic dysfunction of cervical region 3. Somatic dysfunction of thoracic region 4. Somatic dysfunction of lumbar region 5. Somatic dysfunction of pelvic region 6. Somatic dysfunction of rib region -Chronic with exacerbation, subsequent visit - Recurrence of multiple musculoskeletal complaints with most prominent being tension in bilateral thoracic spine, although in general things are significantly improved compared to prior visits - Patient has received significant relief with OMT in the past.  Elects for repeat OMT today.  Tolerated well per note below. - Decision today to treat with OMT was based on Physical Exam   After verbal consent patient was treated with HVLA (high velocity low amplitude), ME (muscle energy), FPR (flex positional release), ST (soft tissue), PC/PD (Pelvic Compression/ Pelvic Decompression) techniques in cervical, rib, thoracic, lumbar, and pelvic areas. Patient tolerated the procedure well with improvement in symptoms.  Patient educated on potential side effects of soreness and recommended to rest, hydrate, and use Tylenol as needed for pain control.   Pertinent previous records reviewed include none   Follow Up: 6 weeks for repeat OMT maintenance    Subjective:   I, Diane [name], am serving as a Neurosurgeon for Doctor Richardean Sale  Chief Complaint: neck pain   HPI:  09/20/21 Patient is a 31 year old female presenting with shoulder pain for about 2.5 weeks. Woke up in pain. No true injury. Patient locates pain to superior and anterior aspect. Pain is with movement and is achy in character. Denies any radiating symptoms. Is able to workout despite pain. Does rollerderby and is now getting into heavy contact practices.    10/07/21 Patient states that her neck shoulder and hips are the  biggest issues that she has OMT for. States that she feels like there is a knot on the left side of her neck. Patient states that her right shoulder has been doing fine.    11/05/21 Patient states that neck and hip and not great had a roller derby scrimmage yesterday , had weird pinch in L shoulder yesterday on top of shoulder, R shoulder is okay tolerable at the moment ,L lateral hip is painful started today   12/04/2021 Patient states   Relevant Historical Information: None pertinent  Additional pertinent review of systems negative.  Current Outpatient Medications  Medication Sig Dispense Refill   loratadine (CLARITIN) 10 MG tablet TAKE 1 TABLET BY MOUTH EVERY DAY 90 tablet 3   norgestimate-ethinyl estradiol (ORTHO-CYCLEN) 0.25-35 MG-MCG tablet TAKE 1 TABLET BY MOUTH EVERY DAY 28 tablet 2   pantoprazole (PROTONIX) 40 MG tablet TAKE 1 TABLET BY MOUTH EVERY DAY 30 tablet 3   sertraline (ZOLOFT) 50 MG tablet TAKE 1 AND 1/2 TABLETS DAILY BY MOUTH 135 tablet 1   tiZANidine (ZANAFLEX) 2 MG tablet TAKE 1 TABLET BY MOUTH EVERYDAY AT BEDTIME 30 tablet 0   Vitamin D, Ergocalciferol, (DRISDOL) 1.25 MG (50000 UNIT) CAPS capsule TAKE 1 CAPSULE BY MOUTH EVERY 7 DAYS. 4 capsule 2   No current facility-administered medications for this visit.      Objective:     Vitals:   12/04/21 1557  BP: 120/80  Pulse: 91  SpO2: 97%  Weight: 164 lb (74.4 kg)  Height: 5\' 2"  (1.575 m)      Body mass index is 30 kg/m.    Physical Exam:  General: Well-appearing, cooperative, sitting comfortably in no acute distress.   OMT Physical Exam:  ASIS Compression Test: Positive Right Cervical: nTTP paraspinal, no significant dysfunction.  Patient states that she "cracked" her neck prior to coming to visit Rib: Bilateral elevated first rib with TTP Thoracic: TTP paraspinal, T3-6 RRSL, T8 8-10 RLSR Lumbar: TTP paraspinal, L1-3 RLSR, L5 RR Pelvis: Right anterior innominate with out flare  Electronically  signed by:  Diane [name] [name] 4:22 PM 12/04/21

## 2021-12-04 ENCOUNTER — Other Ambulatory Visit: Payer: Self-pay

## 2021-12-04 ENCOUNTER — Ambulatory Visit (INDEPENDENT_AMBULATORY_CARE_PROVIDER_SITE_OTHER): Payer: 59 | Admitting: Sports Medicine

## 2021-12-04 VITALS — BP 120/80 | HR 91 | Ht 62.0 in | Wt 164.0 lb

## 2021-12-04 DIAGNOSIS — M9908 Segmental and somatic dysfunction of rib cage: Secondary | ICD-10-CM

## 2021-12-04 DIAGNOSIS — M9905 Segmental and somatic dysfunction of pelvic region: Secondary | ICD-10-CM

## 2021-12-04 DIAGNOSIS — M9901 Segmental and somatic dysfunction of cervical region: Secondary | ICD-10-CM | POA: Diagnosis not present

## 2021-12-04 DIAGNOSIS — M9903 Segmental and somatic dysfunction of lumbar region: Secondary | ICD-10-CM | POA: Diagnosis not present

## 2021-12-04 DIAGNOSIS — M9902 Segmental and somatic dysfunction of thoracic region: Secondary | ICD-10-CM

## 2021-12-04 DIAGNOSIS — G8929 Other chronic pain: Secondary | ICD-10-CM | POA: Diagnosis not present

## 2021-12-04 DIAGNOSIS — M546 Pain in thoracic spine: Secondary | ICD-10-CM

## 2021-12-04 NOTE — Patient Instructions (Addendum)
Good to see you  6 week follow up for repeat OMT

## 2021-12-10 ENCOUNTER — Other Ambulatory Visit: Payer: Self-pay | Admitting: Family Medicine

## 2022-01-06 ENCOUNTER — Other Ambulatory Visit: Payer: Self-pay | Admitting: Physician Assistant

## 2022-01-06 DIAGNOSIS — Z789 Other specified health status: Secondary | ICD-10-CM

## 2022-01-07 ENCOUNTER — Other Ambulatory Visit: Payer: Self-pay | Admitting: Family Medicine

## 2022-01-08 NOTE — Progress Notes (Unsigned)
°   Diane [name] [address] [place] [address] Phone: [phone]   Assessment and Plan:     There are no diagnoses linked to this encounter.  *** - Patient has received significant relief with OMT in the past.  Elects for repeat OMT today.  Tolerated well per note below. - Decision today to treat with OMT was based on Physical Exam   After verbal consent patient was treated with HVLA (high velocity low amplitude), ME (muscle energy), FPR (flex positional release), ST (soft tissue), PC/PD (Pelvic Compression/ Pelvic Decompression) techniques in cervical, rib, thoracic, lumbar, and pelvic areas. Patient tolerated the procedure well with improvement in symptoms.  Patient educated on potential side effects of soreness and recommended to rest, hydrate, and use Tylenol as needed for pain control.   Pertinent previous records reviewed include ***   Follow Up: ***     Subjective:   I, Diane [name], am serving as a Neurosurgeon for Doctor Richardean Sale  Chief Complaint: neck pain   HPI:  09/20/21 Patient is a 31 year old female presenting with shoulder pain for about 2.5 weeks. Woke up in pain. No true injury. Patient locates pain to superior and anterior aspect. Pain is with movement and is achy in character. Denies any radiating symptoms. Is able to workout despite pain. Does rollerderby and is now getting into heavy contact practices.    10/07/21 Patient states that her neck shoulder and hips are the biggest issues that she has OMT for. States that she feels like there is a knot on the left side of her neck. Patient states that her right shoulder has been doing fine.    11/05/21 Patient states that neck and hip and not great had a roller derby scrimmage yesterday , had weird pinch in L shoulder yesterday on top of shoulder, R shoulder is okay tolerable at the moment ,L lateral hip is painful started today    12/04/2021 Patient states    01/15/2022 Patient states    Relevant Historical Information: ***  Additional pertinent review of systems negative.  Current Outpatient Medications  Medication Sig Dispense Refill   loratadine (CLARITIN) 10 MG tablet TAKE 1 TABLET BY MOUTH EVERY DAY 90 tablet 3   norgestimate-ethinyl estradiol (ORTHO-CYCLEN) 0.25-35 MG-MCG tablet TAKE 1 TABLET BY MOUTH EVERY DAY 28 tablet 2   pantoprazole (PROTONIX) 40 MG tablet TAKE 1 TABLET BY MOUTH EVERY DAY 30 tablet 3   sertraline (ZOLOFT) 50 MG tablet TAKE 1 AND 1/2 TABLETS DAILY BY MOUTH 135 tablet 1   tiZANidine (ZANAFLEX) 2 MG tablet TAKE 1 TABLET BY MOUTH EVERYDAY AT BEDTIME 30 tablet 0   Vitamin D, Ergocalciferol, (DRISDOL) 1.25 MG (50000 UNIT) CAPS capsule TAKE 1 CAPSULE BY MOUTH EVERY 7 DAYS. 4 capsule 2   No current facility-administered medications for this visit.      Objective:     There were no vitals filed for this visit.    There is no height or weight on file to calculate BMI.    Physical Exam:     General: Well-appearing, cooperative, sitting comfortably in no acute distress.   OMT Physical Exam:  ASIS Compression Test: Positive Right Cervical: TTP paraspinal, *** Rib: Bilateral elevated first rib with TTP Thoracic: TTP paraspinal,*** Lumbar: TTP paraspinal,*** Pelvis: Right anterior innominate  Electronically signed by:  Diane [name] 8:05 AM 01/08/22

## 2022-01-15 ENCOUNTER — Ambulatory Visit: Payer: 59 | Admitting: Sports Medicine

## 2022-02-03 ENCOUNTER — Other Ambulatory Visit: Payer: Self-pay | Admitting: Family Medicine

## 2022-02-06 ENCOUNTER — Other Ambulatory Visit: Payer: Self-pay | Admitting: Physician Assistant

## 2022-02-11 NOTE — Progress Notes (Signed)
? Diane [name] D.Judd G[facility]ier ?[facility] ?[address] [place] [address] ?Phone: [phone] ?  ?Assessment and Plan:   ?  ?1. Chronic bilateral thoracic back pain ?2. Somatic dysfunction of thoracic region ?3. Somatic dysfunction of lumbar region ?4. Somatic dysfunction of pelvic region ?-Chronic with exacerbation, subsequent visit ?- Overall improved musculoskeletal pains with regular OMT visits ?- Patient has received significant relief with OMT in the past.  Elects for repeat OMT today.  Tolerated well per note below. ?- Decision today to treat with OMT was based on Physical Exam ?  ?After verbal consent patient was treated with HVLA (high velocity low amplitude), ME (muscle energy), FPR (flex positional release), ST (soft tissue), PC/PD (Pelvic Compression/ Pelvic Decompression) techniques in thoracic, lumbar, and pelvic areas. Patient tolerated the procedure well with improvement in symptoms.  Patient educated on potential side effects of soreness and recommended to rest, hydrate, and use Tylenol as needed for pain control. ?  ?Pertinent previous records reviewed include none ?  ?Follow Up: 6 weeks for repeat OMT maintenance ?  ?Subjective:   ?I, Jerene Canny, am serving as a Neurosurgeon for Doctor [employer] ? ?Chief Complaint: OMT follow up  ? ?HPI:  ?09/20/21 ?Patient is a 31 year old female presenting with shoulder pain for about 2.5 weeks. Woke up in pain. No true injury. Patient locates pain to superior and anterior aspect. Pain is with movement and is achy in character. Denies any radiating symptoms. Is able to workout despite pain. Does rollerderby and is now getting into heavy contact practices.  ?  ?10/07/21 ?Patient states that her neck shoulder and hips are the biggest issues that she has OMT for. States that she feels like there is a knot on the left side of her neck. Patient states that her right shoulder has been doing fine.  ?  ?11/05/21 ?Patient states that neck and hip  and not great had a roller derby scrimmage yesterday , had weird pinch in L shoulder yesterday on top of shoulder, R shoulder is okay tolerable at the moment ,L lateral hip is painful started today  ?  ?12/04/2021 ?Patient states  ? ?02/12/2022 ?Patient states that shes doing pretty good,  ? ?Relevant Historical Information: None pertinent ? ?Additional pertinent review of systems negative. ? ?Current Outpatient Medications  ?Medication Sig Dispense Refill  ? loratadine (CLARITIN) 10 MG tablet TAKE 1 TABLET BY MOUTH EVERY DAY 90 tablet 3  ? norgestimate-ethinyl estradiol (ORTHO-CYCLEN) 0.25-35 MG-MCG tablet TAKE 1 TABLET BY MOUTH EVERY DAY 28 tablet 2  ? pantoprazole (PROTONIX) 40 MG tablet TAKE 1 TABLET BY MOUTH EVERY DAY 30 tablet 3  ? sertraline (ZOLOFT) 50 MG tablet TAKE 1 TABLET BY MOUTH EVERY DAY 90 tablet 0  ? tiZANidine (ZANAFLEX) 2 MG tablet TAKE 1 TABLET BY MOUTH EVERYDAY AT BEDTIME 30 tablet 0  ? Vitamin D, Ergocalciferol, (DRISDOL) 1.25 MG (50000 UNIT) CAPS capsule TAKE 1 CAPSULE BY MOUTH EVERY 7 DAYS. 4 capsule 2  ? ?No current facility-administered medications for this visit.  ?  ?  ?Objective:   ?  ?Vitals:  ? 02/12/22 1548  ?BP: 120/80  ?Pulse: 94  ?SpO2: 98%  ?Weight: 160 lb (72.6 kg)  ?Height: 5\' 2"  (1.575 m)  ?  ?  ?Body mass index is 29.26 kg/m?.  ?  ?Physical Exam:   ?  ?General: Well-appearing, cooperative, sitting comfortably in no acute distress.  ? ?OMT Physical Exam: ? ?ASIS Compression Test: Positive Right ?  ?  Thoracic: TTP paraspinal, T3-5 RRSL, T6-9 RLSR ?Lumbar: TTP paraspinal, L1-3 RRSL ?Pelvis: Right anterior innominate ? ?Electronically signed by:  ?Diane [name] D.Judd Gaud[facility] ? [facility] ?4:03 PM 02/12/22 ?

## 2022-02-12 ENCOUNTER — Other Ambulatory Visit: Payer: Self-pay

## 2022-02-12 ENCOUNTER — Ambulatory Visit (INDEPENDENT_AMBULATORY_CARE_PROVIDER_SITE_OTHER): Payer: 59 | Admitting: Sports Medicine

## 2022-02-12 VITALS — BP 120/80 | HR 94 | Ht 62.0 in | Wt 160.0 lb

## 2022-02-12 DIAGNOSIS — M546 Pain in thoracic spine: Secondary | ICD-10-CM

## 2022-02-12 DIAGNOSIS — M9902 Segmental and somatic dysfunction of thoracic region: Secondary | ICD-10-CM

## 2022-02-12 DIAGNOSIS — G8929 Other chronic pain: Secondary | ICD-10-CM | POA: Diagnosis not present

## 2022-02-12 DIAGNOSIS — M9905 Segmental and somatic dysfunction of pelvic region: Secondary | ICD-10-CM

## 2022-02-12 DIAGNOSIS — M9903 Segmental and somatic dysfunction of lumbar region: Secondary | ICD-10-CM

## 2022-02-12 NOTE — Patient Instructions (Addendum)
Good to see you  ?6 week follow for repeat OMT  ?

## 2022-02-15 ENCOUNTER — Other Ambulatory Visit: Payer: Self-pay | Admitting: Emergency Medicine

## 2022-02-17 ENCOUNTER — Other Ambulatory Visit: Payer: Self-pay | Admitting: Family Medicine

## 2022-02-23 ENCOUNTER — Other Ambulatory Visit: Payer: Self-pay | Admitting: Physician Assistant

## 2022-02-23 DIAGNOSIS — Z789 Other specified health status: Secondary | ICD-10-CM

## 2022-03-10 ENCOUNTER — Ambulatory Visit: Payer: 59 | Admitting: Physician Assistant

## 2022-03-10 ENCOUNTER — Encounter: Payer: Self-pay | Admitting: Physician Assistant

## 2022-03-10 VITALS — BP 116/80 | HR 85 | Temp 97.3°F | Ht 62.0 in | Wt 167.0 lb

## 2022-03-10 DIAGNOSIS — E669 Obesity, unspecified: Secondary | ICD-10-CM

## 2022-03-10 DIAGNOSIS — Z124 Encounter for screening for malignant neoplasm of cervix: Secondary | ICD-10-CM

## 2022-03-10 DIAGNOSIS — Z720 Tobacco use: Secondary | ICD-10-CM

## 2022-03-10 DIAGNOSIS — Z136 Encounter for screening for cardiovascular disorders: Secondary | ICD-10-CM | POA: Diagnosis not present

## 2022-03-10 DIAGNOSIS — Z0000 Encounter for general adult medical examination without abnormal findings: Secondary | ICD-10-CM

## 2022-03-10 DIAGNOSIS — Z13220 Encounter for screening for lipoid disorders: Secondary | ICD-10-CM | POA: Diagnosis not present

## 2022-03-10 DIAGNOSIS — Z789 Other specified health status: Secondary | ICD-10-CM

## 2022-03-10 DIAGNOSIS — K219 Gastro-esophageal reflux disease without esophagitis: Secondary | ICD-10-CM

## 2022-03-10 DIAGNOSIS — G4700 Insomnia, unspecified: Secondary | ICD-10-CM

## 2022-03-10 MED ORDER — NORETHINDRONE 0.35 MG PO TABS: 1.0000 | ORAL_TABLET | Freq: Every day | ORAL | 3 refills | Status: DC

## 2022-03-10 MED ORDER — SERTRALINE HCL 50 MG PO TABS: 50.0000 mg | ORAL_TABLET | Freq: Every day | ORAL | 0 refills | Status: DC

## 2022-03-10 NOTE — Patient Instructions (Signed)
It was great to see you! ? ?Stop current birth control ?Start micronor (progesterone only birth control) due to current smoking status ? ?Gynecology referral placed ? ?Please go to the lab for blood work.  ? ?Our office will call you with your results unless you have chosen to receive results via MyChart. ? ?If your blood work is normal we will follow-up each year for physicals and as scheduled for chronic medical problems. ? ?If anything is abnormal we will treat accordingly and get you in for a follow-up. ? ?Take care, ? ?Aldona Bar ?  ?

## 2022-03-10 NOTE — Progress Notes (Signed)
? ?Subjective:  ?  ?Diane [name] is a 31 y.o. female and is here for a comprehensive physical exam. ? ?HPI ? ?Health Maintenance Due  ?Topic Date Due  ? HIV Screening  Never done  ? Hepatitis C Screening  Never done  ? PAP SMEAR-Modifier  Never done  ? COVID-19 Vaccine (3 - Booster for Moderna series) 05/14/2020  ? ? ?Acute Concerns: ?Contraception ?Diane [name] is currently compliant with taking ortho-cyclen 0.25-35 mcg daily with no complications. Although she has been compliant with this medication, she still finds her menstrual cycles aren't significantly changed. States that prior to the medication she was experiencing break through bleeding and while it has become infrequent in occurrence, it is still happening.  Upon further discussion, pt is open to changing her medication especially due to risks of current medication mixed with her being an active smoker.  ? ?Chronic Issues: ?Anxiety/Depression ?Pt is currently compliant with taking zoloft 50 mg daily and vyvanse 60 mg daily with no adverse effects. Pt expresses that this medication has been beneficial to them. At this time she is tolerating well. Denies SI/HI.  ? ?GERD ?Pt is currently compliant with taking protonix 40 mg daily with no complications. She is managing well at this time. Denies concerning sx.  ? ?Insomnia ?She has been sleeping well since starting the catapres 0.1 mg nightly. States that she has found this medication extremely beneficial and is managing well.  ? ?Health Maintenance: ?Immunizations -- Covid-  [id number] ?Influenza- Declined ?Tdap- [id number] ?PAP -- Due ?Bone Density -- N/A ?Dentistry- Will update soon ?Ophthalmology- UTD ?Diet -- Eats all food groups; admits hasn't had best eating habits lately ?Sleep habits -- No concerns ?Exercise -- Participates in roller derby 2 days x week ?Current Weight -- Stable ?Weight History: ?Wt Readings from Last 10 Encounters:  ?03/10/22 167 lb (75.8 kg)  ?02/12/22 160 lb (72.6 kg)  ?12/04/21 164 lb  (74.4 kg)  ?11/05/21 170 lb (77.1 kg)  ?10/07/21 170 lb (77.1 kg)  ?09/20/21 168 lb (76.2 kg)  ?08/22/21 168 lb (76.2 kg)  ?05/28/21 166 lb (75.3 kg)  ?04/03/21 165 lb (74.8 kg)  ?03/05/21 168 lb (76.2 kg)  ? ?Body mass index is 30.54 kg/m?. ?Mood -- Stable ? ?Patient's last menstrual period was 03/07/2022 (exact date). ?Period characteristics -- Normal ?Birth control -- ortho-cyclen 0.25-35 mcg daily  ? ? ? reports current alcohol use.  ?Tobacco Use: High Risk  ? Smoking Tobacco Use: Every Day  ? Smokeless Tobacco Use: Never  ? Passive Exposure: Not on file  ? ? ? ? ?  12/13/2018  ?  1:07 PM  ?Depression screen PHQ 2/9  ?Decreased Interest 1  ?Down, Depressed, Hopeless 1  ?PHQ - 2 Score 2  ?Altered sleeping 2  ?Tired, decreased energy 1  ?Change in appetite 1  ?Feeling bad or failure about yourself  1  ?Trouble concentrating 0  ?Moving slowly or fidgety/restless 0  ?Suicidal thoughts 0  ?PHQ-9 Score 7  ?Difficult doing work/chores Somewhat difficult  ? ? ? ?Other providers/specialists: ?Patient Care Team: ?Inda Coke, PA as PCP - General (Physician Assistant)  ? ?PMHx, SurgHx, SocialHx, Medications, and Allergies were reviewed in the Visit Navigator and updated as appropriate.  ? ?Past Medical History:  ?Diagnosis Date  ? Asthma   ? Vitamin D deficiency   ? ? ? ?Past Surgical History:  ?Procedure Laterality Date  ? TOOTH EXTRACTION    ? ? ? ?Family History  ?Problem Relation Age of Onset  ?  Throat cancer Father   ? ? ?Social History  ? ?Tobacco Use  ? Smoking status: Every Day  ?  Packs/day: 1.00  ?  Years: 9.00  ?  Pack years: 9.00  ?  Types: Cigarettes  ?  Last attempt to quit: 03/16/2018  ?  Years since quitting: 3.9  ? Smokeless tobacco: Never  ? Tobacco comments:  ?  1 pack smoked daily. ARJ 08/31/20  ?Vaping Use  ? Vaping Use: Never used  ?Substance Use Topics  ? Alcohol use: Yes  ?  Comment: occ  ? Drug use: Never  ? ? ?Review of Systems:  ? ?Review of Systems  ?Constitutional:  Negative for chills,  fever, malaise/fatigue and weight loss.  ?HENT:  Negative for hearing loss, sinus pain and sore throat.   ?Respiratory:  Negative for cough and hemoptysis.   ?Cardiovascular:  Negative for chest pain, palpitations, leg swelling and PND.  ?Gastrointestinal:  Negative for abdominal pain, constipation, diarrhea, heartburn, nausea and vomiting.  ?Genitourinary:  Negative for dysuria, frequency and urgency.  ?Musculoskeletal:  Negative for back pain, myalgias and neck pain.  ?Skin:  Negative for itching and rash.  ?Neurological:  Negative for dizziness, tingling, seizures and headaches.  ?Endo/Heme/Allergies:  Negative for polydipsia.  ?Psychiatric/Behavioral:  Negative for depression. The patient is not nervous/anxious.   ? ? ?Objective:  ? ?BP 116/80 (BP Location: Left Arm, Patient Position: Sitting, Cuff Size: Normal)   Pulse 85   Temp (!) 97.3 ?F (36.3 ?C) (Temporal)   Ht 5\' 2"  (1.575 m)   Wt 167 lb (75.8 kg)   LMP 03/07/2022 (Exact Date)   SpO2 98%   BMI 30.54 kg/m?  ? ?General Appearance:    Alert, cooperative, no distress, appears stated age  ?Head:    Normocephalic, without obvious abnormality, atraumatic  ?Eyes:    PERRL, conjunctiva/corneas clear, EOM's intact, fundi  ?  benign, both eyes  ?Ears:    Normal TM's and external ear canals, both ears  ?Nose:   Nares normal, septum midline, mucosa normal, no drainage    or sinus tenderness  ?Throat:   Lips, mucosa, and tongue normal; teeth and gums normal  ?Neck:   Supple, symmetrical, trachea midline, no adenopathy;  ?  thyroid:  no enlargement/tenderness/nodules; no carotid ?  bruit or JVD  ?Back:     Symmetric, no curvature, ROM normal, no CVA tenderness  ?Lungs:     Clear to auscultation bilaterally, respirations unlabored  ?Chest Wall:    No tenderness or deformity  ? Heart:    Regular rate and rhythm, S1 and S2 normal, no murmur, rub   or gallop  ?Breast Exam:    Deferred  ?Abdomen:     Soft, non-tender, bowel sounds active all four quadrants,  ?  no  masses, no organomegaly  ?Genitalia:    Deferred  ?Rectal:    Deferred  ?Extremities:   Extremities normal, atraumatic, no cyanosis or edema  ?Pulses:   2+ and symmetric all extremities  ?Skin:   Skin color, texture, turgor normal, no rashes or lesions  ?Lymph nodes:   Cervical, supraclavicular, and axillary nodes normal  ?Neurologic:   CNII-XII intact, normal strength, sensation and reflexes  ?  throughout  ? ? ?Assessment/Plan:  ? ?Routine physical examination ?Today patient counseled on age appropriate routine health concerns for screening and prevention, each reviewed and up to date or declined. Immunizations reviewed and up to date or declined. Labs ordered and reviewed. Risk factors for depression reviewed  and negative. Hearing function and visual acuity are intact. ADLs screened and addressed as needed. Functional ability and level of safety reviewed and appropriate. Education, counseling and referrals performed based on assessed risks today. Patient provided with a copy of personalized plan for preventive services. ? ?Uses birth control ?No red flags ?Stop ortho-cyclen due to smoking status ?Start Ortho Micronor 0.35 mg daily  ?Follow up if new/worsening symptoms or concerns occur ? ?Tobacco abuse ?Declines smoking cessation assistance  ?Continue to encourage cessation ? ?Encounter for lipid screening for cardiovascular disease ?Update lipid profile, will start medication and make recommendations based on blood work results ? ?Pap smear for cervical cancer screening ?Will refer to gynecology for completion  ? ?Insomnia ?Well controlled due to clonidine, mgmt per specialist ? ?Obesity, unspecified classification, unspecified obesity type, unspecified whether serious comorbidity present ?Encouraged patient to participate in healthy eating and regular exercise  ? ? ?Patient Counseling: ? ? ?[x]    ? Nutrition: Stressed importance of moderation in sodium/caffeine intake, saturated fat and cholesterol, caloric  balance, sufficient intake of fresh fruits, vegetables, fiber, calcium, iron, and 1 mg of folate supplement per day (for females capable of pregnancy).  ? ?[x]    ?  ?Stressed the importance of regular exercise.   ? ?[

## 2022-03-11 LAB — COMPREHENSIVE METABOLIC PANEL
ALT: 20 U/L (ref 0–35)
AST: 21 U/L (ref 0–37)
Albumin: 4.4 g/dL (ref 3.5–5.2)
Alkaline Phosphatase: 70 U/L (ref 39–117)
BUN: 8 mg/dL (ref 6–23)
CO2: 26 mEq/L (ref 19–32)
Calcium: 9.1 mg/dL (ref 8.4–10.5)
Chloride: 102 mEq/L (ref 96–112)
Creatinine, Ser: 0.79 mg/dL (ref 0.40–1.20)
GFR: 100.06 mL/min (ref >60.00)
Glucose, Bld: 81 mg/dL (ref 70–99)
Potassium: 3.6 mEq/L (ref 3.5–5.1)
Sodium: 137 mEq/L (ref 135–145)
Total Bilirubin: 0.2 mg/dL (ref 0.2–1.2)
Total Protein: 7.1 g/dL (ref 6.0–8.3)

## 2022-03-11 LAB — CBC WITH DIFFERENTIAL/PLATELET
Basophils Absolute: 0 10*3/uL (ref 0.0–0.1)
Basophils Relative: 0.6 % (ref 0.0–3.0)
Eosinophils Absolute: 0.5 10*3/uL (ref 0.0–0.7)
Eosinophils Relative: 5.8 % — ABNORMAL HIGH (ref 0.0–5.0)
HCT: 39.2 % (ref 36.0–46.0)
Hemoglobin: 13.2 g/dL (ref 12.0–15.0)
Lymphocytes Relative: 29.5 % (ref 12.0–46.0)
Lymphs Abs: 2.5 10*3/uL (ref 0.7–4.0)
MCHC: 33.6 g/dL (ref 30.0–36.0)
MCV: 91.2 fl (ref 78.0–100.0)
Monocytes Absolute: 0.4 10*3/uL (ref 0.1–1.0)
Monocytes Relative: 4.7 % (ref 3.0–12.0)
Neutro Abs: 5 10*3/uL (ref 1.4–7.7)
Neutrophils Relative %: 59.4 % (ref 43.0–77.0)
Platelets: 302 10*3/uL (ref 150.0–400.0)
RBC: 4.3 Mil/uL (ref 3.87–5.11)
RDW: 13.3 % (ref 11.5–15.5)
WBC: 8.4 10*3/uL (ref 4.0–10.5)

## 2022-03-11 LAB — LIPID PANEL
Cholesterol: 221 mg/dL — ABNORMAL HIGH (ref 0–200)
HDL: 54.4 mg/dL (ref >39.00)
LDL Cholesterol: 145 mg/dL — ABNORMAL HIGH (ref 0–99)
NonHDL: 166.24
Total CHOL/HDL Ratio: 4
Triglycerides: 108 mg/dL (ref 0.0–149.0)
VLDL: 21.6 mg/dL (ref 0.0–40.0)

## 2022-04-01 NOTE — Progress Notes (Signed)
? Aleen Sells D.Judd Gaudier ?[facility] ?[address] [place] [address] ?Phone: [phone] ?  ?Assessment and Plan:   ?  ?1. Chronic bilateral thoracic back pain ?2. Somatic dysfunction of thoracic region ?3. Somatic dysfunction of lumbar region ?4. Somatic dysfunction of pelvic region ?-Chronic with exacerbation, subsequent visit ?- Multiple musculoskeletal complaints with most prominent being right hip flexor and thoracic pain, worsened after recent roller Derby bout with several additional bouts scheduled in the next few weeks ?- Start HEP for hip flexor ?- Patient has received significant relief with OMT in the past.  Elects for repeat OMT today.  Tolerated well per note below. ?- Decision today to treat with OMT was based on Physical Exam ?  ?After verbal consent patient was treated with HVLA (high velocity low amplitude), ME (muscle energy), FPR (flex positional release), ST (soft tissue), PC/PD (Pelvic Compression/ Pelvic Decompression) techniques in  thoracic, lumbar, and pelvic areas. Patient tolerated the procedure well with improvement in symptoms.  Patient educated on potential side effects of soreness and recommended to rest, hydrate, and use Tylenol as needed for pain control. ?  ?Pertinent previous records reviewed include none ?  ?Follow Up: 1 to 2 weeks for repeat OMT maintenance.  Plan to do OMT more frequently in the next few weeks with patient having multiple roller Derby bouts scheduled ?  ?Subjective:   ? ?I, Jerene Canny, am serving as a Neurosurgeon for Doctor [employer] ? ?Chief Complaint: OMT follow up  ?  ?HPI:  ?09/20/21 ?Patient is a 31 year old female presenting with shoulder pain for about 2.5 weeks. Woke up in pain. No true injury. Patient locates pain to superior and anterior aspect. Pain is with movement and is achy in character. Denies any radiating symptoms. Is able to workout despite pain. Does rollerderby and is now getting into heavy contact practices.   ?  ?10/07/21 ?Patient states that her neck shoulder and hips are the biggest issues that she has OMT for. States that she feels like there is a knot on the left side of her neck. Patient states that her right shoulder has been doing fine.  ?  ?11/05/21 ?Patient states that neck and hip and not great had a roller derby scrimmage yesterday , had weird pinch in L shoulder yesterday on top of shoulder, R shoulder is okay tolerable at the moment ,L lateral hip is painful started today  ?  ?12/04/2021 ?Patient states  ?  ?02/12/2022 ?Patient states that shes doing pretty good,  ? ?04/03/2022 ?Patient states she is okay she is a little sore had her first game this week roller derby  ? ? ?Relevant Historical Information: None pertinent ? ?Additional pertinent review of systems negative. ? ?Current Outpatient Medications  ?Medication Sig Dispense Refill  ? cloNIDine (CATAPRES) 0.1 MG tablet Take 0.1 mg by mouth at bedtime.    ? loratadine (CLARITIN) 10 MG tablet TAKE 1 TABLET BY MOUTH EVERY DAY 90 tablet 3  ? norethindrone (ORTHO MICRONOR) 0.35 MG tablet Take 1 tablet (0.35 mg total) by mouth daily. 84 tablet 3  ? pantoprazole (PROTONIX) 40 MG tablet TAKE 1 TABLET BY MOUTH EVERY DAY 30 tablet 3  ? sertraline (ZOLOFT) 50 MG tablet Take 1 tablet (50 mg total) by mouth daily. 90 tablet 0  ? tiZANidine (ZANAFLEX) 2 MG tablet TAKE 1 TABLET BY MOUTH EVERYDAY AT BEDTIME 90 tablet 1  ? VYVANSE 60 MG capsule Take 60 mg by mouth daily.    ? ?  No current facility-administered medications for this visit.  ?  ?  ?Objective:   ?  ?Vitals:  ? 04/03/22 1552  ?BP: 112/80  ?Pulse: (!) 120  ?SpO2: 99%  ?Weight: 168 lb (76.2 kg)  ?Height: 5\' 2"  (1.575 m)  ?  ?  ?Body mass index is 30.73 kg/m?.  ?  ?Physical Exam:   ?  ?General: Well-appearing, cooperative, sitting comfortably in no acute distress.  ? ?OMT Physical Exam: ? ?ASIS Compression Test: Positive Right ?  ?Thoracic: TTP paraspinal, T2 RR SR, T4-7 RRSL ?Lumbar: TTP paraspinal, L1-3  RRSL ?Pelvis: Right anterior innominate ? ?Electronically signed by:  ? D.Aleen Sells ?[facility] ?4:06 PM 04/03/22 ?

## 2022-04-03 ENCOUNTER — Ambulatory Visit (INDEPENDENT_AMBULATORY_CARE_PROVIDER_SITE_OTHER): Payer: 59 | Admitting: Sports Medicine

## 2022-04-03 VITALS — BP 112/80 | HR 120 | Ht 62.0 in | Wt 168.0 lb

## 2022-04-03 DIAGNOSIS — M9903 Segmental and somatic dysfunction of lumbar region: Secondary | ICD-10-CM | POA: Diagnosis not present

## 2022-04-03 DIAGNOSIS — M9905 Segmental and somatic dysfunction of pelvic region: Secondary | ICD-10-CM

## 2022-04-03 DIAGNOSIS — G8929 Other chronic pain: Secondary | ICD-10-CM

## 2022-04-03 DIAGNOSIS — M9902 Segmental and somatic dysfunction of thoracic region: Secondary | ICD-10-CM

## 2022-04-03 DIAGNOSIS — M546 Pain in thoracic spine: Secondary | ICD-10-CM

## 2022-04-03 NOTE — Patient Instructions (Addendum)
Good to see you  ?Hip flexor exercises ?2 week follow up for repeat OMT ? ?

## 2022-04-08 NOTE — Progress Notes (Signed)
? Diane [name] D.Judd Gaudier ?[facility] ?[address] [place] [address] ?Phone: [phone] ?  ?Assessment and Plan:   ?  ?1. Chronic bilateral low back pain without sciatica ?2. Chronic bilateral thoracic back pain ?3. Somatic dysfunction of thoracic region ?4. Somatic dysfunction of lumbar region ?5. Somatic dysfunction of pelvic region ?6. Somatic dysfunction of rib region ?7. Somatic dysfunction of sacral region ?-Chronic with exacerbation, subsequent visit ?- Recurrence of multiple musculoskeletal complaints with a particularly severe flare in low back pain and upper thoracic pain since previous visit.  No red flag symptoms, so no imaging performed.  Suspect that roller derby practice may have flared symptoms ?- Start meloxicam 15 mg daily x2 weeks.  If still having pain after 2 weeks, complete 3rd-week of meloxicam. May use remaining meloxicam as needed once daily for pain control.  Do not to use additional NSAIDs while taking meloxicam.  May use Tylenol [phone] mg 2 to 3 times a day for breakthrough pain. ?- Start Flexeril 5 to 10 mg nightly as needed for muscle spasms ?- Patient has received significant relief with OMT in the past.  Elects for repeat OMT today.  Tolerated well per note below. ?- Decision today to treat with OMT was based on Physical Exam ?  ?After verbal consent patient was treated with HVLA (high velocity low amplitude), ME (muscle energy), FPR (flex positional release), ST (soft tissue), PC/PD (Pelvic Compression/ Pelvic Decompression) techniques in sacrum, rib, thoracic, lumbar, and pelvic areas. Patient tolerated the procedure well with improvement in symptoms.  Patient educated on potential side effects of soreness and recommended to rest, hydrate, and use Tylenol as needed for pain control. ?  ?Pertinent previous records reviewed include none ?  ?Follow Up: 2 weeks for reevaluation and repeat OMT ?  ?Subjective:   ? ?I, Jerene Canny, am serving as a Neurosurgeon for  Doctor [employer] ? ?Chief Complaint: OMT follow up  ?  ?HPI:  ?09/20/21 ?Patient is a 31 year old female presenting with shoulder pain for about 2.5 weeks. Woke up in pain. No true injury. Patient locates pain to superior and anterior aspect. Pain is with movement and is achy in character. Denies any radiating symptoms. Is able to workout despite pain. Does rollerderby and is now getting into heavy contact practices.  ?  ?10/07/21 ?Patient states that her neck shoulder and hips are the biggest issues that she has OMT for. States that she feels like there is a knot on the left side of her neck. Patient states that her right shoulder has been doing fine.  ?  ?11/05/21 ?Patient states that neck and hip and not great had a roller derby scrimmage yesterday , had weird pinch in L shoulder yesterday on top of shoulder, R shoulder is okay tolerable at the moment ,L lateral hip is painful started today  ?  ?12/04/2021 ?Patient states  ?  ?02/12/2022 ?Patient states that shes doing pretty good,  ?  ?04/03/2022 ?Patient states she is okay she is a little sore had her first game this week roller derby  ? ?04/09/2022 ?Patient states that she's in a lot of pain, low back that is radiating up to the upper back do to compensation  ? ?Relevant Historical Information: None pertinent ? ?Additional pertinent review of systems negative. ? ?Current Outpatient Medications  ?Medication Sig Dispense Refill  ? cloNIDine (CATAPRES) 0.1 MG tablet Take 0.1 mg by mouth at bedtime.    ? cyclobenzaprine (FLEXERIL) 5 MG  tablet Take 1 tablet (5 mg total) by mouth 3 (three) times daily as needed for muscle spasms. 20 tablet 0  ? loratadine (CLARITIN) 10 MG tablet TAKE 1 TABLET BY MOUTH EVERY DAY 90 tablet 3  ? meloxicam (MOBIC) 15 MG tablet Take 1 tablet (15 mg total) by mouth daily. 30 tablet 0  ? norethindrone (ORTHO MICRONOR) 0.35 MG tablet Take 1 tablet (0.35 mg total) by mouth daily. 84 tablet 3  ? pantoprazole (PROTONIX) 40 MG tablet TAKE 1  TABLET BY MOUTH EVERY DAY 30 tablet 3  ? sertraline (ZOLOFT) 50 MG tablet Take 1 tablet (50 mg total) by mouth daily. 90 tablet 0  ? tiZANidine (ZANAFLEX) 2 MG tablet TAKE 1 TABLET BY MOUTH EVERYDAY AT BEDTIME 90 tablet 1  ? VYVANSE 60 MG capsule Take 60 mg by mouth daily.    ? ?No current facility-administered medications for this visit.  ?  ?  ?Objective:   ?  ?Vitals:  ? 04/09/22 1549  ?BP: 118/80  ?Pulse: (!) 105  ?SpO2: 99%  ?Weight: 168 lb (76.2 kg)  ?Height: 5\' 2"  (1.575 m)  ?  ?  ?Body mass index is 30.73 kg/m?.  ?  ?Physical Exam:   ?  ?General: Well-appearing, cooperative, sitting comfortably in no acute distress.  ? ?OMT Physical Exam: ? ?ASIS Compression Test: Positive Right ?Sacrum: Positive sphinx, moderate TTP right sacral base ?Rib: Ribs 3-5 stuck  in inhalation on the left ?Thoracic: TTP paraspinal,T3-5 RLSR ?Lumbar: TTP paraspinal, L1-3 RRSL ?Pelvis: Right anterior innominate ? ?Gen: Appears well, nad, nontoxic and pleasant ?Psych: Alert and oriented, appropriate mood and affect ?Neuro: sensation intact, strength is 5/5 in upper and lower extremities, muscle tone wnl ?Skin: no susupicious lesions or rashes ? ?Back - Normal skin, Spine with normal alignment and no deformity.   ?No tenderness to vertebral process palpation.   ?Lumbar paraspinous muscles are moderately tender and without spasm ?Straight leg raise negative ?  ? ?Electronically signed by:  ? D.Diane [name] ?[facility] ?4:19 PM 04/09/22 ?

## 2022-04-09 ENCOUNTER — Ambulatory Visit (INDEPENDENT_AMBULATORY_CARE_PROVIDER_SITE_OTHER): Payer: 59 | Admitting: Sports Medicine

## 2022-04-09 VITALS — BP 118/80 | HR 105 | Ht 62.0 in | Wt 168.0 lb

## 2022-04-09 DIAGNOSIS — G8929 Other chronic pain: Secondary | ICD-10-CM | POA: Diagnosis not present

## 2022-04-09 DIAGNOSIS — M9903 Segmental and somatic dysfunction of lumbar region: Secondary | ICD-10-CM | POA: Diagnosis not present

## 2022-04-09 DIAGNOSIS — M546 Pain in thoracic spine: Secondary | ICD-10-CM

## 2022-04-09 DIAGNOSIS — M9904 Segmental and somatic dysfunction of sacral region: Secondary | ICD-10-CM

## 2022-04-09 DIAGNOSIS — M9905 Segmental and somatic dysfunction of pelvic region: Secondary | ICD-10-CM | POA: Diagnosis not present

## 2022-04-09 DIAGNOSIS — M9902 Segmental and somatic dysfunction of thoracic region: Secondary | ICD-10-CM | POA: Diagnosis not present

## 2022-04-09 DIAGNOSIS — M545 Low back pain, unspecified: Secondary | ICD-10-CM

## 2022-04-09 DIAGNOSIS — M9908 Segmental and somatic dysfunction of rib cage: Secondary | ICD-10-CM | POA: Diagnosis not present

## 2022-04-09 MED ORDER — MELOXICAM 15 MG PO TABS: 15.0000 mg | ORAL_TABLET | Freq: Every day | ORAL | 0 refills | Status: AC

## 2022-04-09 MED ORDER — CYCLOBENZAPRINE HCL 5 MG PO TABS: 5.0000 mg | ORAL_TABLET | Freq: Three times a day (TID) | ORAL | 0 refills | Status: AC | PRN

## 2022-04-09 NOTE — Patient Instructions (Addendum)
Good to see you  ?- Start meloxicam 15 mg daily x2 weeks.  If still having pain after 2 weeks, complete 3rd-week of meloxicam. May use remaining meloxicam as needed once daily for pain control.  Do not to use additional NSAIDs while taking meloxicam.  May use Tylenol [phone] mg 2 to 3 times a day for breakthrough pain. ?Start flexeril 5-10 mg nightly as needed for muscle spasm 20 0 ?2 week follow up ?

## 2022-04-10 ENCOUNTER — Encounter: Payer: Self-pay | Admitting: Sports Medicine

## 2022-04-25 ENCOUNTER — Ambulatory Visit: Payer: 59 | Admitting: Sports Medicine

## 2022-05-07 ENCOUNTER — Other Ambulatory Visit: Payer: Self-pay | Admitting: Sports Medicine

## 2022-05-07 NOTE — Progress Notes (Signed)
Diane [name] D.[facility] Phone: [phone]   Assessment and Plan:     1. Chronic bilateral low back pain without sciatica 2. Chronic bilateral thoracic back pain 3. Somatic dysfunction of thoracic region 4. Somatic dysfunction of lumbar region 5. Somatic dysfunction of pelvic region -Chronic with exacerbation, subsequent visit - Significant improvement in acute flare of low back pain after completion of 3-week course of meloxicam 15 mg daily, Flexeril as needed use, OMT - Discontinue Flexeril - Discontinue daily meloxicam and use remainder as needed - Patient has received significant relief with OMT in the past.  Elects for repeat OMT today.  Tolerated well per note below. - Decision today to treat with OMT was based on Physical Exam  After verbal consent patient was treated with HVLA (high velocity low amplitude), ME (muscle energy), FPR (flex positional release), ST (soft tissue), PC/PD (Pelvic Compression/ Pelvic Decompression) techniques in   thoracic, lumbar, and pelvic areas. Patient tolerated the procedure well with improvement in symptoms.  Patient educated on potential side effects of soreness and recommended to rest, hydrate, and use Tylenol as needed for pain control.    Pertinent previous records reviewed include none   Follow Up: 4 weeks for repeat OMT and continued treatment   Subjective:   I, Diane [name], am serving as a Education administrator for Doctor Diane [name]  Chief Complaint: OMT follow up    HPI:  09/20/21 Patient is a 31 year old female presenting with shoulder pain for about 2.5 weeks. Woke up in pain. No true injury. Patient locates pain to superior and anterior aspect. Pain is with movement and is achy in character. Denies any radiating symptoms. Is able to workout despite pain. Does rollerderby and is now getting into heavy contact practices.    10/07/21 Patient states that her  neck shoulder and hips are the biggest issues that she has OMT for. States that she feels like there is a knot on the left side of her neck. Patient states that her right shoulder has been doing fine.    11/05/21 Patient states that neck and hip and not great had a roller derby scrimmage yesterday , had weird pinch in L shoulder yesterday on top of shoulder, R shoulder is okay tolerable at the moment ,L lateral hip is painful started today    12/04/2021 Patient states    02/12/2022 Patient states that shes doing pretty good,    04/03/2022 Patient states she is okay she is a little sore had her first game this week roller derby    04/09/2022 Patient states that she's in a lot of pain, low back that is radiating up to the upper back do to compensation  05/08/2022 Patient states she is much better , as long as she doesn't block backwards she is fine    Relevant Historical Information: None pertinent  Additional pertinent review of systems negative.   Current Outpatient Medications:    cloNIDine (CATAPRES) 0.1 MG tablet, Take 0.1 mg by mouth at bedtime., Disp: , Rfl:    cyclobenzaprine (FLEXERIL) 5 MG tablet, Take 1 tablet (5 mg total) by mouth 3 (three) times daily as needed for muscle spasms., Disp: 20 tablet, Rfl: 0   loratadine (CLARITIN) 10 MG tablet, TAKE 1 TABLET BY MOUTH EVERY DAY, Disp: 90 tablet, Rfl: 3   meloxicam (MOBIC) 15 MG tablet, Take 1 tablet (15 mg total) by mouth daily., Disp: 30 tablet, Rfl: 0  norethindrone (ORTHO MICRONOR) 0.35 MG tablet, Take 1 tablet (0.35 mg total) by mouth daily., Disp: 84 tablet, Rfl: 3   pantoprazole (PROTONIX) 40 MG tablet, TAKE 1 TABLET BY MOUTH EVERY DAY, Disp: 30 tablet, Rfl: 3   sertraline (ZOLOFT) 50 MG tablet, Take 1 tablet (50 mg total) by mouth daily., Disp: 90 tablet, Rfl: 0   tiZANidine (ZANAFLEX) 2 MG tablet, TAKE 1 TABLET BY MOUTH EVERYDAY AT BEDTIME, Disp: 90 tablet, Rfl: 1   VYVANSE 60 MG capsule, Take 60 mg by mouth daily., Disp: ,  Rfl:    Objective:     Vitals:   05/08/22 1537  BP: 118/74  Pulse: (!) 106  SpO2: 98%  Weight: 171 lb (77.6 kg)  Height: 5\' 2"  (1.575 m)      Body mass index is 31.28 kg/m.    Physical Exam:    General: Well-appearing, cooperative, sitting comfortably in no acute distress.   OMT Physical Exam:  ASIS Compression Test: Positive Right   Thoracic: TTP paraspinal, T2-6 RRSL, T7-9 RLSR Lumbar: TTP paraspinal, L1-3 RRSL, L5 RL Pelvis: Right anterior innominate    Electronically signed by:  Diane [name] D.[facility] 4:05 PM 05/08/22

## 2022-05-08 ENCOUNTER — Ambulatory Visit (INDEPENDENT_AMBULATORY_CARE_PROVIDER_SITE_OTHER): Payer: 59 | Admitting: Sports Medicine

## 2022-05-08 VITALS — BP 118/74 | HR 106 | Ht 62.0 in | Wt 171.0 lb

## 2022-05-08 DIAGNOSIS — G8929 Other chronic pain: Secondary | ICD-10-CM

## 2022-05-08 DIAGNOSIS — M545 Low back pain, unspecified: Secondary | ICD-10-CM | POA: Diagnosis not present

## 2022-05-08 DIAGNOSIS — M9903 Segmental and somatic dysfunction of lumbar region: Secondary | ICD-10-CM

## 2022-05-08 DIAGNOSIS — M546 Pain in thoracic spine: Secondary | ICD-10-CM | POA: Diagnosis not present

## 2022-05-08 DIAGNOSIS — M9905 Segmental and somatic dysfunction of pelvic region: Secondary | ICD-10-CM

## 2022-05-08 DIAGNOSIS — M9902 Segmental and somatic dysfunction of thoracic region: Secondary | ICD-10-CM

## 2022-05-08 NOTE — Patient Instructions (Addendum)
Good to see you  Discontinue daily meloxicam can use remainder as needed 4 week follow up for repeat OMT

## 2022-05-20 ENCOUNTER — Encounter: Payer: Self-pay | Admitting: Sports Medicine

## 2022-06-04 NOTE — Progress Notes (Unsigned)
    Aleen Sells [name] [address] [place] [address] Phone: [phone]   Assessment and Plan:     There are no diagnoses linked to this encounter.  ***   Pertinent previous records reviewed include ***   Follow Up: ***     Subjective:   I, Josejuan Hoaglin, am serving as a Neurosurgeon for Doctor Richardean Sale  Chief Complaint: OMT follow up    HPI:  09/20/21 Patient is a 31 year old female presenting with shoulder pain for about 2.5 weeks. Woke up in pain. No true injury. Patient locates pain to superior and anterior aspect. Pain is with movement and is achy in character. Denies any radiating symptoms. Is able to workout despite pain. Does rollerderby and is now getting into heavy contact practices.    10/07/21 Patient states that her neck shoulder and hips are the biggest issues that she has OMT for. States that she feels like there is a knot on the left side of her neck. Patient states that her right shoulder has been doing fine.    11/05/21 Patient states that neck and hip and not great had a roller derby scrimmage yesterday , had weird pinch in L shoulder yesterday on top of shoulder, R shoulder is okay tolerable at the moment ,L lateral hip is painful started today    12/04/2021 Patient states    02/12/2022 Patient states that shes doing pretty good,    04/03/2022 Patient states she is okay she is a little sore had her first game this week roller derby    04/09/2022 Patient states that she's in a lot of pain, low back that is radiating up to the upper back do to compensation   05/08/2022 Patient states she is much better , as long as she doesn't block backwards she is fine    06/05/2022 Patient states    Relevant Historical Information: None pertinent  Additional pertinent review of systems negative.   Current Outpatient Medications:    cloNIDine (CATAPRES) 0.1 MG tablet, Take 0.1 mg by mouth at bedtime., Disp: , Rfl:     cyclobenzaprine (FLEXERIL) 5 MG tablet, Take 1 tablet (5 mg total) by mouth 3 (three) times daily as needed for muscle spasms., Disp: 20 tablet, Rfl: 0   loratadine (CLARITIN) 10 MG tablet, TAKE 1 TABLET BY MOUTH EVERY DAY, Disp: 90 tablet, Rfl: 3   meloxicam (MOBIC) 15 MG tablet, Take 1 tablet (15 mg total) by mouth daily., Disp: 30 tablet, Rfl: 0   norethindrone (ORTHO MICRONOR) 0.35 MG tablet, Take 1 tablet (0.35 mg total) by mouth daily., Disp: 84 tablet, Rfl: 3   pantoprazole (PROTONIX) 40 MG tablet, TAKE 1 TABLET BY MOUTH EVERY DAY, Disp: 30 tablet, Rfl: 3   sertraline (ZOLOFT) 50 MG tablet, Take 1 tablet (50 mg total) by mouth daily., Disp: 90 tablet, Rfl: 0   tiZANidine (ZANAFLEX) 2 MG tablet, TAKE 1 TABLET BY MOUTH EVERYDAY AT BEDTIME, Disp: 90 tablet, Rfl: 1   VYVANSE 60 MG capsule, Take 60 mg by mouth daily., Disp: , Rfl:    Objective:     There were no vitals filed for this visit.    There is no height or weight on file to calculate BMI.    Physical Exam:    ***   Electronically signed by:  Aleen Sells [name] 11:51 AM 06/04/22

## 2022-06-05 ENCOUNTER — Ambulatory Visit (INDEPENDENT_AMBULATORY_CARE_PROVIDER_SITE_OTHER): Payer: 59 | Admitting: Sports Medicine

## 2022-06-05 VITALS — BP 114/80 | HR 110 | Ht 62.0 in | Wt 170.0 lb

## 2022-06-05 DIAGNOSIS — M9903 Segmental and somatic dysfunction of lumbar region: Secondary | ICD-10-CM

## 2022-06-05 DIAGNOSIS — M542 Cervicalgia: Secondary | ICD-10-CM

## 2022-06-05 DIAGNOSIS — M9908 Segmental and somatic dysfunction of rib cage: Secondary | ICD-10-CM

## 2022-06-05 DIAGNOSIS — M9901 Segmental and somatic dysfunction of cervical region: Secondary | ICD-10-CM

## 2022-06-05 DIAGNOSIS — M9902 Segmental and somatic dysfunction of thoracic region: Secondary | ICD-10-CM | POA: Diagnosis not present

## 2022-06-05 DIAGNOSIS — M9905 Segmental and somatic dysfunction of pelvic region: Secondary | ICD-10-CM

## 2022-06-05 NOTE — Patient Instructions (Signed)
4 week follow up

## 2022-06-06 ENCOUNTER — Other Ambulatory Visit: Payer: Self-pay | Admitting: Emergency Medicine

## 2022-07-07 ENCOUNTER — Ambulatory Visit: Payer: 59 | Admitting: Sports Medicine

## 2022-07-22 ENCOUNTER — Other Ambulatory Visit: Payer: Self-pay | Admitting: Physician Assistant

## 2022-10-13 NOTE — Progress Notes (Unsigned)
    Aleen Sells [name] [address] [place] [address] Phone: [phone]   Assessment and Plan:     There are no diagnoses linked to this encounter.  ***   Pertinent previous records reviewed include ***   Follow Up: ***     Subjective:   I, Manal Kreutzer, am serving as a Neurosurgeon for Doctor Richardean Sale  Chief Complaint: left ankle pain   HPI:   10/14/2022 Patient is a 31 year old female complaining of left ankle pain . Patient states  Relevant Historical Information: ***  Additional pertinent review of systems negative.   Current Outpatient Medications:    cloNIDine (CATAPRES) 0.1 MG tablet, Take 0.1 mg by mouth at bedtime., Disp: , Rfl:    cyclobenzaprine (FLEXERIL) 5 MG tablet, Take 1 tablet (5 mg total) by mouth 3 (three) times daily as needed for muscle spasms., Disp: 20 tablet, Rfl: 0   loratadine (CLARITIN) 10 MG tablet, TAKE 1 TABLET BY MOUTH EVERY DAY, Disp: 90 tablet, Rfl: 3   meloxicam (MOBIC) 15 MG tablet, Take 1 tablet (15 mg total) by mouth daily., Disp: 30 tablet, Rfl: 0   norethindrone (ORTHO MICRONOR) 0.35 MG tablet, Take 1 tablet (0.35 mg total) by mouth daily., Disp: 84 tablet, Rfl: 3   pantoprazole (PROTONIX) 40 MG tablet, TAKE 1 TABLET BY MOUTH EVERY DAY, Disp: 30 tablet, Rfl: 3   sertraline (ZOLOFT) 50 MG tablet, TAKE 1 TABLET BY MOUTH EVERY DAY, Disp: 90 tablet, Rfl: 1   tiZANidine (ZANAFLEX) 2 MG tablet, TAKE 1 TABLET BY MOUTH EVERYDAY AT BEDTIME, Disp: 90 tablet, Rfl: 1   VYVANSE 60 MG capsule, Take 60 mg by mouth daily., Disp: , Rfl:    Objective:     There were no vitals filed for this visit.    There is no height or weight on file to calculate BMI.    Physical Exam:    ***   Electronically signed by:  Aleen Sells [name] 4:12 PM 10/13/22

## 2022-10-14 ENCOUNTER — Ambulatory Visit (INDEPENDENT_AMBULATORY_CARE_PROVIDER_SITE_OTHER): Payer: 59 | Admitting: Sports Medicine

## 2022-10-14 ENCOUNTER — Ambulatory Visit (INDEPENDENT_AMBULATORY_CARE_PROVIDER_SITE_OTHER): Payer: 59

## 2022-10-14 VITALS — BP 118/82 | HR 84 | Ht 62.0 in | Wt 170.0 lb

## 2022-10-14 DIAGNOSIS — S93402A Sprain of unspecified ligament of left ankle, initial encounter: Secondary | ICD-10-CM

## 2022-10-14 DIAGNOSIS — M25572 Pain in left ankle and joints of left foot: Secondary | ICD-10-CM

## 2022-10-14 DIAGNOSIS — S86302A Unspecified injury of muscle(s) and tendon(s) of peroneal muscle group at lower leg level, left leg, initial encounter: Secondary | ICD-10-CM

## 2022-10-14 MED ORDER — MELOXICAM 15 MG PO TABS: 15.0000 mg | ORAL_TABLET | Freq: Every day | ORAL | 0 refills | Status: AC

## 2022-10-14 NOTE — Patient Instructions (Addendum)
Good to see you - Start meloxicam 15 mg daily x2 weeks.  If still having pain after 2 weeks, complete 3rd-week of meloxicam. May use remaining meloxicam as needed once daily for pain control.  Do not to use additional NSAIDs while taking meloxicam.  May use Tylenol [phone] mg 2 to 3 times a day for breakthrough pain. No skating for 2 weeks  Ankle HEP  3 week follow up

## 2022-11-03 NOTE — Progress Notes (Unsigned)
Diane [name] [name] [address] [place] [address] Phone: [phone]   Assessment and Plan:    1. Acute left ankle pain  -Acute, uncomplicated, improved, subsequent visit - Most consistent with peroneal tendon strain and lateral ankle sprain that has significantly improved with relative rest, HEP, course of meloxicam - Discontinue meloxicam and use remainder as needed - Continue HEP - May return to skating activities  Pertinent previous records reviewed include none   Follow Up: As needed if no improvement or worsening of symptoms.  Patient described trigger finger occurring on 2 separate occasions with a social history of heart activities that involve loss of hand use.  May follow-up if trigger finger worsening to discuss CSI   Subjective:   I, Diane [name], am serving as a Neurosurgeon for Doctor Richardean Sale   Chief Complaint: left ankle pain    HPI:    10/14/2022 Patient is a 31 year old female complaining of left ankle pain . Patient state that her foot was a sleep and now its asleep when she is awake, pain all over the ankle been going on for 2 weeks no MOI, no numbness or tingling, tylenol and ib help when she does take , was able to skate on Wednesday for game , when she turns her foot a certain way she gets radiating pain up the leg , when she tapes her ankle the pain is worst   11/04/2022 Patient states that she is a lot better, has little flares here and there     Relevant Historical Information: None pertinent  Additional pertinent review of systems negative.   Current Outpatient Medications:    cloNIDine (CATAPRES) 0.1 MG tablet, Take 0.1 mg by mouth at bedtime., Disp: , Rfl:    cyclobenzaprine (FLEXERIL) 5 MG tablet, Take 1 tablet (5 mg total) by mouth 3 (three) times daily as needed for muscle spasms., Disp: 20 tablet, Rfl: 0   loratadine (CLARITIN) 10 MG tablet, TAKE 1 TABLET BY MOUTH EVERY DAY, Disp: 90  tablet, Rfl: 3   meloxicam (MOBIC) 15 MG tablet, Take 1 tablet (15 mg total) by mouth daily., Disp: 30 tablet, Rfl: 0   meloxicam (MOBIC) 15 MG tablet, Take 1 tablet (15 mg total) by mouth daily., Disp: 30 tablet, Rfl: 0   norethindrone (ORTHO MICRONOR) 0.35 MG tablet, Take 1 tablet (0.35 mg total) by mouth daily., Disp: 84 tablet, Rfl: 3   pantoprazole (PROTONIX) 40 MG tablet, TAKE 1 TABLET BY MOUTH EVERY DAY, Disp: 30 tablet, Rfl: 3   sertraline (ZOLOFT) 50 MG tablet, TAKE 1 TABLET BY MOUTH EVERY DAY, Disp: 90 tablet, Rfl: 1   tiZANidine (ZANAFLEX) 2 MG tablet, TAKE 1 TABLET BY MOUTH EVERYDAY AT BEDTIME, Disp: 90 tablet, Rfl: 1   VYVANSE 60 MG capsule, Take 60 mg by mouth daily., Disp: , Rfl:    Objective:     Vitals:   11/04/22 1538  BP: 118/78  Pulse: (!) 105  SpO2: 99%  Weight: 167 lb (75.8 kg)  Height: 5\' 2"  (1.575 m)      Body mass index is 30.54 kg/m.    Physical Exam:    Gen: Appears well, nad, nontoxic and pleasant Psych: Alert and oriented, appropriate mood and affect Neuro: sensation intact, strength is 5/5 with df/pf/inv/ev, muscle tone wnl Skin: no susupicious lesions or rashes  Left ankle: no deformity, no swelling or effusion NTTP over fibular head, lat mal, medial mal, achilles, navicular, base of  5th, ATFL, CFL, deltoid, calcaneous or midfoot ROM DF 30, PF 45, inv/ev intact Negative ant drawer, talar tilt, rotation test, squeeze test. Neg thompson No pain with resisted inversion or eversion    Electronically signed by:  Diane [name] [name] 3:52 PM 11/04/22

## 2022-11-04 ENCOUNTER — Ambulatory Visit (INDEPENDENT_AMBULATORY_CARE_PROVIDER_SITE_OTHER): Payer: 59 | Admitting: Sports Medicine

## 2022-11-04 VITALS — BP 118/78 | HR 105 | Ht 62.0 in | Wt 167.0 lb

## 2022-11-04 DIAGNOSIS — M25572 Pain in left ankle and joints of left foot: Secondary | ICD-10-CM | POA: Diagnosis not present

## 2022-11-04 NOTE — Patient Instructions (Addendum)
Good to see you  Call us if trigger finger worsens As needed follow up

## 2023-02-18 ENCOUNTER — Other Ambulatory Visit: Payer: Self-pay | Admitting: Physician Assistant

## 2023-03-11 IMAGING — DX DG CLAVICLE*R*
2 series · 2 of 2 positions shown · non-contrast
Comparison: None

CLINICAL DATA: Patient complains of right clavicular pain x 2
weeks. NKI.

EXAM:
RIGHT CLAVICLE - 2+ VIEWS

[clavicle ap]
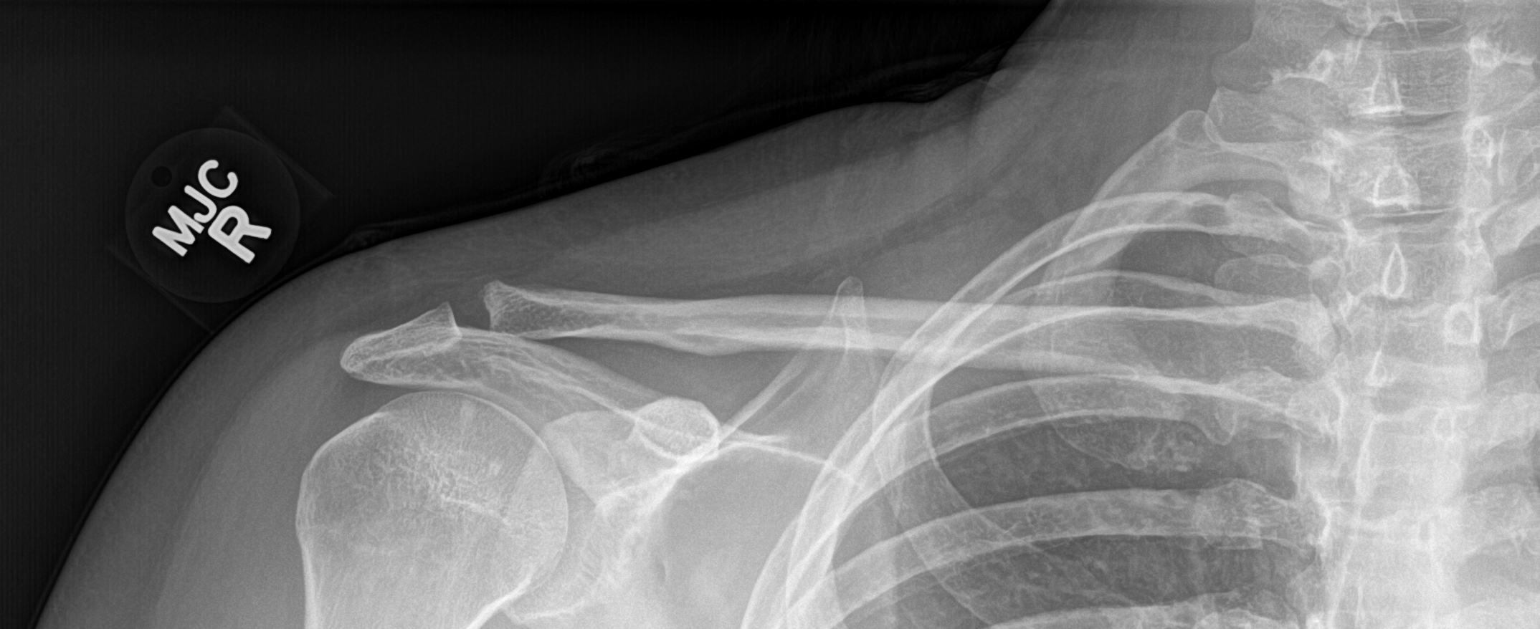

[clavicle axial]
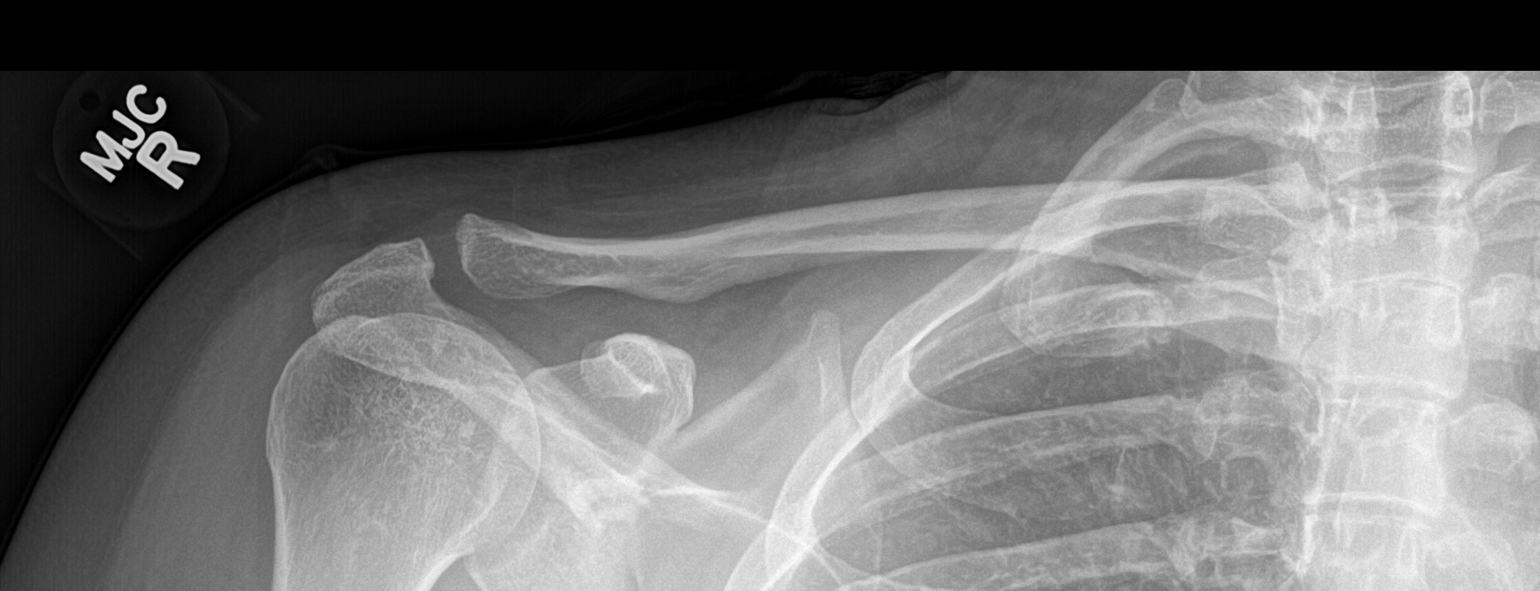

[2 of 2 positions shown; findings below may reference images not displayed]

FINDINGS: There is no evidence of fracture or other focal bone lesions. Soft
tissues are unremarkable.
IMPRESSION: Negative.

## 2023-05-10 ENCOUNTER — Other Ambulatory Visit: Payer: Self-pay | Admitting: Physician Assistant

## 2024-08-08 NOTE — Progress Notes (Unsigned)
 Diane [name] D[name] [address] [place] [address] Phone: [phone]   Assessment and Plan:     1. Left hip pain (Primary) -Chronic with exacerbation, initial visit - Left-sided anterior lateral hip pain present for 4+ months without specific MOI.  Concern for labral pathology based on HPI, physical exam.  Will initiate treatment targeting tight hip flexor musculature to see if symptoms resolve and if no improvement would proceed with MRI - Will obtain x-ray at today's visit and can further review at follow-up visit - Start meloxicam  15 mg daily x2 weeks.  If still having pain after 2 weeks, complete 3rd-week of NSAID. May use remaining NSAID as needed once daily for pain control.  Do not to use additional over-the-counter NSAIDs (ibuprofen, naproxen, Advil, Aleve, etc.) while taking prescription NSAIDs.  May use Tylenol [phone] mg 2 to 3 times a day for breakthrough pain. - Start HEP for hip, specifically targeting hip flexors  15 additional minutes spent for educating Therapeutic Home Exercise Program.  This included exercises focusing on stretching, strengthening, with focus on eccentric aspects.   Long term goals include an improvement in range of motion, strength, endurance as well as avoiding reinjury. Patient's frequency would include in 1-2 times a day, 3-5 times a week for a duration of 6-12 weeks. Proper technique shown and discussed handout in great detail with ATC.  All questions were discussed and answered.    Pertinent previous records reviewed include none   Follow Up: 6 weeks for reevaluation.  If no improvement or worsening of symptoms, could consider left hip MRI with intra-articular contrast   Subjective:   I, Diane [name], am serving as a Neurosurgeon for Doctor Morene Mace  Chief Complaint: hip pain   HPI:   08/09/2024 Patient is a 33 year old female with hip pain. Patient states pain started earlier this year.  Roller [place]. States its her hip flexor. She is not able to do ADLS. No MOI. Intermittent meds. No numbness or tingling. She is not able to skate when in flare. Pain is lateral and radiates anteriorly. Decreased ROM    Relevant Historical Information: None pertinent  Additional pertinent review of systems negative.   Current Outpatient Medications:    meloxicam  (MOBIC ) 15 MG tablet, Take 1 tablet (15 mg total) by mouth daily., Disp: 30 tablet, Rfl: 0   cloNIDine (CATAPRES) 0.1 MG tablet, Take 0.1 mg by mouth at bedtime., Disp: , Rfl:    cyclobenzaprine  (FLEXERIL ) 5 MG tablet, Take 1 tablet (5 mg total) by mouth 3 (three) times daily as needed for muscle spasms., Disp: 20 tablet, Rfl: 0   loratadine  (CLARITIN ) 10 MG tablet, TAKE 1 TABLET BY MOUTH EVERY DAY, Disp: 90 tablet, Rfl: 3   meloxicam  (MOBIC ) 15 MG tablet, Take 1 tablet (15 mg total) by mouth daily., Disp: 30 tablet, Rfl: 0   meloxicam  (MOBIC ) 15 MG tablet, Take 1 tablet (15 mg total) by mouth daily., Disp: 30 tablet, Rfl: 0   norethindrone  (MICRONOR ) 0.35 MG tablet, TAKE 1 TABLET BY MOUTH EVERY DAY, Disp: 84 tablet, Rfl: 0   pantoprazole  (PROTONIX ) 40 MG tablet, TAKE 1 TABLET BY MOUTH EVERY DAY, Disp: 30 tablet, Rfl: 3   sertraline  (ZOLOFT ) 50 MG tablet, TAKE 1 TABLET BY MOUTH EVERY DAY, Disp: 90 tablet, Rfl: 1   tiZANidine  (ZANAFLEX ) 2 MG tablet, TAKE 1 TABLET BY MOUTH EVERYDAY AT BEDTIME, Disp: 90 tablet, Rfl: 1   VYVANSE 60 MG capsule, Take  60 mg by mouth daily., Disp: , Rfl:    Objective:     Vitals:   08/09/24 1555  Pulse: (!) 102  SpO2: 100%  Weight: 183 lb (83 kg)  Height: 5' 2 (1.575 m)      Body mass index is 33.47 kg/m.    Physical Exam:    General: awake, alert, and oriented no acute distress, nontoxic Skin: no suspicious lesions or rashes Neuro:sensation intact distally with no deficits, normal muscle tone, no atrophy, strength 5/5 in all tested lower ext groups Psych: normal mood and affect, speech clear    Left hip: No deformity, swelling or wasting ROM Flexion 90, ext 30, IR 45, ER 45 NTTP over the hip flexors, greater trochanter, gluteal musculature, si joint, lumbar spine Negative log roll with FROM Negative FABER Positive FADIR for groin pain Negative Piriformis test, the increased tightness on left compared to right Negative trendelenberg Gait normal  Thomas test with 4 finger breaths on the left and 2.5 fingerbreadths on right  Electronically signed by:  Odis Mace D[name] 4:19 PM 08/09/24

## 2024-08-09 ENCOUNTER — Ambulatory Visit

## 2024-08-09 ENCOUNTER — Ambulatory Visit (INDEPENDENT_AMBULATORY_CARE_PROVIDER_SITE_OTHER): Admitting: Sports Medicine

## 2024-08-09 VITALS — HR 102 | Ht 62.0 in | Wt 183.0 lb

## 2024-08-09 DIAGNOSIS — M25552 Pain in left hip: Secondary | ICD-10-CM

## 2024-08-09 MED ORDER — MELOXICAM 15 MG PO TABS: 15.0000 mg | ORAL_TABLET | Freq: Every day | ORAL | 0 refills | Status: AC

## 2024-08-09 NOTE — Patient Instructions (Signed)
-   Start meloxicam  15 mg daily x2 weeks.  If still having pain after 2 weeks, complete 3rd-week of NSAID. May use remaining NSAID as needed once daily for pain control.  Do not to use additional over-the-counter NSAIDs (ibuprofen, naproxen, Advil, Aleve, etc.) while taking prescription NSAIDs.  May use Tylenol [phone] mg 2 to 3 times a day for breakthrough pain.  Hip HEP   Xrays on the way out   6 week follow up

## 2024-08-17 ENCOUNTER — Ambulatory Visit: Payer: Self-pay | Admitting: Sports Medicine

## 2024-09-19 NOTE — Progress Notes (Unsigned)
 [name] [address] [place] [address] Phone: [phone]   Assessment and Plan:     1. Left hip pain (Primary) -Chronic with exacerbation, subsequent visit - Overall improvement in left lateral/anterior hip pain has been present for 5+ months without specific MOI.  Patient felt improvement with meloxicam  course, relative rest, HEP.  Differential includes hip flexor strain versus greater trochanteric bursitis versus labral tear - X-ray was overall reassuring with only slight left hip joint space narrowing - Use meloxicam  15 mg daily as needed for breakthrough pain.  Recommend limiting chronic NSAIDs to 1-2 doses per week to prevent long-term side effects. Use Tylenol 500 to 1000 mg tablets 2-3 times a day as needed for day-to-day pain relief.    -Continue HEP and gradually increase physical activity as tolerated - Start physical therapy.  Referral provided    Pertinent previous records reviewed include none   Follow Up: 6 weeks for reevaluation.  If no improvement or worsening of symptoms, would consider left hip MRI to evaluate for potential labral tear   Subjective:   I, Diane [name], am serving as a Neurosurgeon for Diane [name]   Chief Complaint: hip pain    HPI:    08/09/2024 Patient is a 33 year old female with hip pain. Patient states pain started earlier this year. Roller [place]. States its her hip flexor. She is not able to do ADLS. No MOI. Intermittent meds. No numbness or tingling. She is not able to skate when in flare. Pain is lateral and radiates anteriorly. Decreased ROM    09/20/2024 Patient states she is better. Flares dont last as long, but still present    Relevant Historical Information: None pertinent  Additional pertinent review of systems negative.   Current Outpatient Medications:    cloNIDine (CATAPRES) 0.1 MG tablet, Take 0.1 mg by mouth at bedtime., Disp: , Rfl:    cyclobenzaprine   (FLEXERIL ) 5 MG tablet, Take 1 tablet (5 mg total) by mouth 3 (three) times daily as needed for muscle spasms., Disp: 20 tablet, Rfl: 0   loratadine  (CLARITIN ) 10 MG tablet, TAKE 1 TABLET BY MOUTH EVERY DAY, Disp: 90 tablet, Rfl: 3   meloxicam  (MOBIC ) 15 MG tablet, Take 1 tablet (15 mg total) by mouth daily., Disp: 30 tablet, Rfl: 0   meloxicam  (MOBIC ) 15 MG tablet, Take 1 tablet (15 mg total) by mouth daily., Disp: 30 tablet, Rfl: 0   meloxicam  (MOBIC ) 15 MG tablet, Take 1 tablet (15 mg total) by mouth daily., Disp: 30 tablet, Rfl: 0   norethindrone  (MICRONOR ) 0.35 MG tablet, TAKE 1 TABLET BY MOUTH EVERY DAY, Disp: 84 tablet, Rfl: 0   pantoprazole  (PROTONIX ) 40 MG tablet, TAKE 1 TABLET BY MOUTH EVERY DAY, Disp: 30 tablet, Rfl: 3   sertraline  (ZOLOFT ) 50 MG tablet, TAKE 1 TABLET BY MOUTH EVERY DAY, Disp: 90 tablet, Rfl: 1   tiZANidine  (ZANAFLEX ) 2 MG tablet, TAKE 1 TABLET BY MOUTH EVERYDAY AT BEDTIME, Disp: 90 tablet, Rfl: 1   VYVANSE 60 MG capsule, Take 60 mg by mouth daily., Disp: , Rfl:    Objective:     Vitals:   09/20/24 1600  BP: 108/78  Weight: 187 lb (84.8 kg)  Height: 5' 2 (1.575 m)      Body mass index is 34.2 kg/m.    Physical Exam:    General: awake, alert, and oriented no acute distress, nontoxic Skin: no suspicious lesions or rashes Neuro:sensation intact distally  with no deficits, normal muscle tone, no atrophy, strength 5/5 in all tested lower ext groups Psych: normal mood and affect, speech clear   Left hip: No deformity, swelling or wasting ROM Flexion 90, ext 30, IR 45, ER 45 NTTP over the hip flexors, greater trochanter, gluteal musculature, si joint, lumbar spine Negative log roll with FROM Negative FABER Positive FADIR for groin pain Negative Piriformis test, the increased tightness on left compared to right Negative trendelenberg Gait normal  Thomas test with 4 finger breaths on the left and 2.5 fingerbreadths on right    Electronically signed by:   Diane [name] D[name] 4:15 PM 09/20/24

## 2024-09-20 ENCOUNTER — Ambulatory Visit: Admitting: Sports Medicine

## 2024-09-20 VITALS — BP 108/78 | Ht 62.0 in | Wt 187.0 lb

## 2024-09-20 DIAGNOSIS — M25552 Pain in left hip: Secondary | ICD-10-CM

## 2024-09-20 NOTE — Patient Instructions (Addendum)
 PT referral   - Use meloxicam  15 mg daily as needed for breakthrough pain.  Recommend limiting chronic NSAIDs to 1-2 doses per week to prevent long-term side effects. Use Tylenol 500 to 1000 mg tablets 2-3 times a day as needed for day-to-day pain relief.    Gradually increase activity as tolerated   6 week follow up

## 2024-09-22 ENCOUNTER — Encounter: Payer: Self-pay | Admitting: Sports Medicine

## 2024-11-01 ENCOUNTER — Ambulatory Visit: Admitting: Sports Medicine

## 2024-11-17 ENCOUNTER — Ambulatory Visit: Admitting: Sports Medicine

## 2024-12-05 NOTE — Progress Notes (Signed)
 "               Odis Mace D[name] [address] [place] [address] Phone: [phone]   Assessment and Plan:     1. Left hip pain (Primary) -Chronic with exacerbation, subsequent visit - Overall continued improvement in the left lateral/anterior hip pain present for 6+ months without specific MOI.  Suspect improving trochanteric bursitis, hip flexor strain.  Differential continues to include labral pathology - Recommend returning to physical activity as tolerated, continuing HEP and physical therapy - Use meloxicam  15 mg daily as needed for breakthrough pain.  Recommend limiting chronic NSAIDs to 1-2 doses per week to prevent long-term side effects. Use Tylenol 500 to 1000 mg tablets 2-3 times a day as needed for day-to-day pain relief.   .  May call for refill if needed    Pertinent previous records reviewed include none   Follow Up: As needed if no improvement or worsening of symptoms.  If pain returns identically with patient returning to skating activities, we can order left hip MRI with intra-articular contrast to evaluate for possible labral tear   Subjective:   I, Chestine Reeves, am serving as a neurosurgeon for Doctor Morene Mace   Chief Complaint: hip pain    HPI:    08/09/2024 Patient is a 34 year old female with hip pain. Patient states pain started earlier this year. Roller [place]. States its her hip flexor. She is not able to do ADLS. No MOI. Intermittent meds. No numbness or tingling. She is not able to skate when in flare. Pain is lateral and radiates anteriorly. Decreased ROM    09/20/2024 Patient states she is better. Flares dont last as long, but still present   12/06/2024 Patient states she is a lot better    Relevant Historical Information: None pertinent  Additional pertinent review of systems negative.  Current Medications[1]   Objective:     Vitals:   12/06/24 1531  Pulse: (!) 116  SpO2: 96%  Weight: 184 lb (83.5  kg)  Height: 5' 2 (1.575 m)      Body mass index is 33.65 kg/m.    Physical Exam:    General: awake, alert, and oriented no acute distress, nontoxic Skin: no suspicious lesions or rashes Neuro:sensation intact distally with no deficits, normal muscle tone, no atrophy, strength 5/5 in all tested lower ext groups Psych: normal mood and affect, speech clear   Left hip: No deformity, swelling or wasting ROM Flexion 90, ext 30, IR 45, ER 45 NTTP over the hip flexors, greater trochanter, gluteal musculature, si joint, lumbar spine Negative log roll with FROM Negative FABER Negative FADIR Negative Piriformis test Negative trendelenberg Gait normal    Electronically signed by:  Odis Mace [name] 3:40 PM 12/06/2024     [1]  Current Outpatient Medications:    cloNIDine (CATAPRES) 0.1 MG tablet, Take 0.1 mg by mouth at bedtime., Disp: , Rfl:    cyclobenzaprine  (FLEXERIL ) 5 MG tablet, Take 1 tablet (5 mg total) by mouth 3 (three) times daily as needed for muscle spasms., Disp: 20 tablet, Rfl: 0   loratadine  (CLARITIN ) 10 MG tablet, TAKE 1 TABLET BY MOUTH EVERY DAY, Disp: 90 tablet, Rfl: 3   meloxicam  (MOBIC ) 15 MG tablet, Take 1 tablet (15 mg total) by mouth daily., Disp: 30 tablet, Rfl: 0   meloxicam  (MOBIC ) 15 MG tablet, Take 1 tablet (15 mg total) by mouth daily., Disp: 30 tablet,  Rfl: 0   meloxicam  (MOBIC ) 15 MG tablet, Take 1 tablet (15 mg total) by mouth daily., Disp: 30 tablet, Rfl: 0   norethindrone  (MICRONOR ) 0.35 MG tablet, TAKE 1 TABLET BY MOUTH EVERY DAY, Disp: 84 tablet, Rfl: 0   pantoprazole  (PROTONIX ) 40 MG tablet, TAKE 1 TABLET BY MOUTH EVERY DAY, Disp: 30 tablet, Rfl: 3   sertraline  (ZOLOFT ) 50 MG tablet, TAKE 1 TABLET BY MOUTH EVERY DAY, Disp: 90 tablet, Rfl: 1   tiZANidine  (ZANAFLEX ) 2 MG tablet, TAKE 1 TABLET BY MOUTH EVERYDAY AT BEDTIME, Disp: 90 tablet, Rfl: 1   VYVANSE 60 MG capsule, Take 60 mg by mouth daily., Disp: , Rfl:   "

## 2024-12-06 ENCOUNTER — Ambulatory Visit: Admitting: Sports Medicine

## 2024-12-06 VITALS — HR 116 | Ht 62.0 in | Wt 184.0 lb

## 2024-12-06 DIAGNOSIS — M25552 Pain in left hip: Secondary | ICD-10-CM | POA: Diagnosis not present
